# Patient Record
Sex: Female | Born: 1995 | Race: White | Hispanic: No | Marital: Single | Smoking: Never smoker
Health system: Southern US, Community
[De-identification: ages and names within clinical notes are randomized; demographics above are authoritative.]

## PROBLEM LIST (undated history)

## (undated) DIAGNOSIS — F938 Other childhood emotional disorders: Secondary | ICD-10-CM

## (undated) DIAGNOSIS — K219 Gastro-esophageal reflux disease without esophagitis: Secondary | ICD-10-CM

## (undated) DIAGNOSIS — F4323 Adjustment disorder with mixed anxiety and depressed mood: Secondary | ICD-10-CM

## (undated) DIAGNOSIS — F419 Anxiety disorder, unspecified: Secondary | ICD-10-CM

## (undated) DIAGNOSIS — S82201A Unspecified fracture of shaft of right tibia, initial encounter for closed fracture: Secondary | ICD-10-CM

## (undated) DIAGNOSIS — J45909 Unspecified asthma, uncomplicated: Secondary | ICD-10-CM

## (undated) DIAGNOSIS — F410 Panic disorder [episodic paroxysmal anxiety] without agoraphobia: Secondary | ICD-10-CM

## (undated) DIAGNOSIS — S82401A Unspecified fracture of shaft of right fibula, initial encounter for closed fracture: Secondary | ICD-10-CM

## (undated) HISTORY — PX: NO PAST SURGERIES: SHX2092

## (undated) HISTORY — DX: Unspecified fracture of shaft of right tibia, initial encounter for closed fracture: S82.201A

## (undated) HISTORY — DX: Unspecified fracture of shaft of right tibia, initial encounter for closed fracture: S82.401A

---
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
## 2020-11-09 NOTE — L&D Delivery Note (Signed)
OB/GYN Faculty Practice Delivery Note  Michelle Bird is a 25 y.o. G1P0 s/p NSVD at [redacted]w[redacted]d. She was admitted for SOL.   ROM: 0h 60m with clear, scant fluid GBS Status:  Negative/-- (12/06 1527) Maximum Maternal Temperature: N/A  Labor Progress: Initial SVE: 6/100/+1. She then progressed to complete.   Delivery Date/Time: 11/04/2021 @ 20:24 Delivery: Called to room and patient was complete. We began pushing. Head delivered LOA. Single nuchal cord present easily reduced at perineum. Shoulder and body delivered in usual fashion. Infant with spontaneous cry, placed on mother's abdomen, dried and stimulated. Cord clamped x 2 after 1-minute delay, and cut by FOB. Cord blood drawn. Placenta delivered spontaneously with gentle cord traction. Fundus firm with massage and Pitocin. Labia, perineum, vagina, and cervix inspected inspected with 2nd degree lac and left vaginal pumping laceration.   Baby Weight: pending  Placenta: Sent to L&D Complications: None Lacerations: 2nd degree perineal repaired with 2-0 Monocryl; Left vaginal laceration repaired with 3-0 Vicryl. EBL: 350 mL Analgesia: Epidural, local lidocaine for repair.   Infant:  APGAR (1 MIN): 9   APGAR (5 MINS): 9   APGAR (10 MINS):     Jen Mow, DO [facility] [employer], [facility] 11/04/2021, 9:10 PM

## 2021-03-17 ENCOUNTER — Emergency Department (HOSPITAL_BASED_OUTPATIENT_CLINIC_OR_DEPARTMENT_OTHER)
Admission: EM | Admit: 2021-03-17 | Discharge: 2021-03-17 | Disposition: A | Discharge status: Home / Self Care | Payer: 59 | Source: Home / Self Care | Attending: Emergency Medicine | Admitting: Emergency Medicine

## 2021-03-17 ENCOUNTER — Encounter (HOSPITAL_COMMUNITY): Payer: Self-pay

## 2021-03-17 ENCOUNTER — Encounter (HOSPITAL_BASED_OUTPATIENT_CLINIC_OR_DEPARTMENT_OTHER): Payer: Self-pay

## 2021-03-17 ENCOUNTER — Emergency Department (HOSPITAL_BASED_OUTPATIENT_CLINIC_OR_DEPARTMENT_OTHER): Payer: 59

## 2021-03-17 ENCOUNTER — Other Ambulatory Visit: Payer: Self-pay

## 2021-03-17 ENCOUNTER — Inpatient Hospital Stay (HOSPITAL_COMMUNITY)
Admission: EM | Admit: 2021-03-17 | Discharge: 2021-03-19 | DRG: 818 | Disposition: A | Discharge status: Home / Self Care | Payer: 59 | Attending: Orthopedic Surgery | Admitting: Orthopedic Surgery

## 2021-03-17 DIAGNOSIS — Y92410 Unspecified street and highway as the place of occurrence of the external cause: Secondary | ICD-10-CM

## 2021-03-17 DIAGNOSIS — O26819 Pregnancy related exhaustion and fatigue, unspecified trimester: Secondary | ICD-10-CM | POA: Insufficient documentation

## 2021-03-17 DIAGNOSIS — Z3A00 Weeks of gestation of pregnancy not specified: Secondary | ICD-10-CM | POA: Insufficient documentation

## 2021-03-17 DIAGNOSIS — E559 Vitamin D deficiency, unspecified: Secondary | ICD-10-CM

## 2021-03-17 DIAGNOSIS — O2390 Unspecified genitourinary tract infection in pregnancy, unspecified trimester: Secondary | ICD-10-CM | POA: Insufficient documentation

## 2021-03-17 DIAGNOSIS — Z3A01 Less than 8 weeks gestation of pregnancy: Secondary | ICD-10-CM

## 2021-03-17 DIAGNOSIS — R000 Tachycardia, unspecified: Secondary | ICD-10-CM | POA: Insufficient documentation

## 2021-03-17 DIAGNOSIS — Z9889 Other specified postprocedural states: Secondary | ICD-10-CM

## 2021-03-17 DIAGNOSIS — R8271 Bacteriuria: Secondary | ICD-10-CM

## 2021-03-17 DIAGNOSIS — Z3490 Encounter for supervision of normal pregnancy, unspecified, unspecified trimester: Secondary | ICD-10-CM

## 2021-03-17 DIAGNOSIS — S82201A Unspecified fracture of shaft of right tibia, initial encounter for closed fracture: Secondary | ICD-10-CM | POA: Diagnosis present

## 2021-03-17 DIAGNOSIS — R42 Dizziness and giddiness: Secondary | ICD-10-CM | POA: Insufficient documentation

## 2021-03-17 DIAGNOSIS — O9A211 Injury, poisoning and certain other consequences of external causes complicating pregnancy, first trimester: Principal | ICD-10-CM | POA: Diagnosis present

## 2021-03-17 DIAGNOSIS — Z419 Encounter for procedure for purposes other than remedying health state, unspecified: Secondary | ICD-10-CM

## 2021-03-17 DIAGNOSIS — Z20822 Contact with and (suspected) exposure to covid-19: Secondary | ICD-10-CM | POA: Diagnosis present

## 2021-03-17 DIAGNOSIS — O99341 Other mental disorders complicating pregnancy, first trimester: Secondary | ICD-10-CM | POA: Diagnosis present

## 2021-03-17 DIAGNOSIS — F4323 Adjustment disorder with mixed anxiety and depressed mood: Secondary | ICD-10-CM | POA: Diagnosis present

## 2021-03-17 DIAGNOSIS — T148XXA Other injury of unspecified body region, initial encounter: Secondary | ICD-10-CM

## 2021-03-17 DIAGNOSIS — Z79899 Other long term (current) drug therapy: Secondary | ICD-10-CM

## 2021-03-17 DIAGNOSIS — S82401A Unspecified fracture of shaft of right fibula, initial encounter for closed fracture: Secondary | ICD-10-CM | POA: Diagnosis present

## 2021-03-17 DIAGNOSIS — K219 Gastro-esophageal reflux disease without esophagitis: Secondary | ICD-10-CM | POA: Diagnosis present

## 2021-03-17 DIAGNOSIS — O99611 Diseases of the digestive system complicating pregnancy, first trimester: Secondary | ICD-10-CM | POA: Diagnosis present

## 2021-03-17 HISTORY — DX: Gastro-esophageal reflux disease without esophagitis: K21.9

## 2021-03-17 LAB — CBC WITH DIFFERENTIAL/PLATELET
Abs Immature Granulocytes: 0.04 10*3/uL (ref 0.00–0.07)
Basophils Absolute: 0 10*3/uL (ref 0.0–0.1)
Basophils Relative: 0 %
Eosinophils Absolute: 0 10*3/uL (ref 0.0–0.5)
Eosinophils Relative: 0 %
HCT: 39.7 % (ref 36.0–46.0)
Hemoglobin: 13 g/dL (ref 12.0–15.0)
Immature Granulocytes: 0 %
Lymphocytes Relative: 18 %
Lymphs Abs: 2 10*3/uL (ref 0.7–4.0)
MCH: 25.9 pg — ABNORMAL LOW (ref 26.0–34.0)
MCHC: 32.7 g/dL (ref 30.0–36.0)
MCV: 79.1 fL — ABNORMAL LOW (ref 80.0–100.0)
Monocytes Absolute: 0.6 10*3/uL (ref 0.1–1.0)
Monocytes Relative: 5 %
Neutro Abs: 8.2 10*3/uL — ABNORMAL HIGH (ref 1.7–7.7)
Neutrophils Relative %: 77 %
Platelets: 383 10*3/uL (ref 150–400)
RBC: 5.02 MIL/uL (ref 3.87–5.11)
RDW: 14 % (ref 11.5–15.5)
WBC: 10.8 10*3/uL — ABNORMAL HIGH (ref 4.0–10.5)
nRBC: 0 % (ref 0.0–0.2)

## 2021-03-17 LAB — URINALYSIS, ROUTINE W REFLEX MICROSCOPIC
Bilirubin Urine: NEGATIVE
Bilirubin Urine: NEGATIVE
Glucose, UA: NEGATIVE mg/dL
Glucose, UA: NEGATIVE mg/dL
Hgb urine dipstick: NEGATIVE
Hgb urine dipstick: NEGATIVE
Ketones, ur: NEGATIVE mg/dL
Ketones, ur: NEGATIVE mg/dL
Nitrite: NEGATIVE
Nitrite: NEGATIVE
Protein, ur: NEGATIVE mg/dL
Protein, ur: NEGATIVE mg/dL
Specific Gravity, Urine: 1.01 (ref 1.005–1.030)
Specific Gravity, Urine: <1.005 — ABNORMAL LOW (ref 1.005–1.030)
pH: 7 (ref 5.0–8.0)
pH: 6 (ref 5.0–8.0)

## 2021-03-17 LAB — BASIC METABOLIC PANEL
Anion gap: 10 (ref 5–15)
BUN: 8 mg/dL (ref 6–20)
CO2: 22 mmol/L (ref 22–32)
Calcium: 9.4 mg/dL (ref 8.9–10.3)
Chloride: 102 mmol/L (ref 98–111)
Creatinine, Ser: 0.73 mg/dL (ref 0.44–1.00)
GFR, Estimated: >60 mL/min (ref >60)
Glucose, Bld: 97 mg/dL (ref 70–99)
Potassium: 3.7 mmol/L (ref 3.5–5.1)
Sodium: 134 mmol/L — ABNORMAL LOW (ref 135–145)

## 2021-03-17 LAB — URINALYSIS, MICROSCOPIC (REFLEX): RBC / HPF: NONE SEEN RBC/hpf (ref 0–5)

## 2021-03-17 LAB — HCG, QUANTITATIVE, PREGNANCY: hCG, Beta Chain, Quant, S: 33379 m[IU]/mL — ABNORMAL HIGH (ref <5)

## 2021-03-17 LAB — PREGNANCY, URINE: Preg Test, Ur: POSITIVE — AB

## 2021-03-17 MED ORDER — ACETAMINOPHEN 325 MG PO TABS
650.0000 mg | ORAL_TABLET | Freq: Once | ORAL | Status: AC
  Administered 2021-03-17: 650 mg via ORAL
  Filled 2021-03-17: qty 2

## 2021-03-17 MED ORDER — CEPHALEXIN 500 MG PO CAPS: 500.0000 mg | ORAL_CAPSULE | Freq: Three times a day (TID) | ORAL | 0 refills | Status: DC

## 2021-03-17 NOTE — ED Triage Notes (Signed)
Pt c/o feeling light headed at work ~12pm-states she is 14 days late and requests preg test-NAD-steady gait
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
## 2021-03-17 NOTE — ED Notes (Signed)
ED Provider at bedside. 

## 2021-03-17 NOTE — Discharge Instructions (Addendum)
Follow-up with OB/GYN physician within the next 1 to 2 weeks.  Return immediately to the ER if you have fevers worsening pain vaginal bleeding or any additional concerns.

## 2021-03-17 NOTE — ED Notes (Signed)
Patient transported to Ultrasound 

## 2021-03-17 NOTE — ED Notes (Signed)
Pt able to wiggle toes on command

## 2021-03-17 NOTE — ED Provider Notes (Signed)
[facility] EMERGENCY DEPARTMENT Provider Note   CSN: [id number] Arrival date & time: 03/17/21  1551     History Chief Complaint  Patient presents with  . Dizziness    Michelle Bird is a 25 y.o. female.  Patient presents to ER chief complaint of lightheaded and dizziness been going on for about 2 to 3 days.  She was late on her period as well.  She had some lower abdominal cramping intermittent for the past few days.  Denies vaginal bleeding.  No fever no cough no vomiting or diarrhea no headache no chest pain.        Past Medical History:  Diagnosis Date  . Adjustment disorder with mixed anxiety and depressed mood 10/08/2015  . Anxiety   . 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 GERD (gastroesophageal reflux disease)   . Panic attacks     Patient Active Problem List   Diagnosis Date Noted  . Adjustment disorder with mixed anxiety and depressed mood 10/08/2015  . 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 No pertinent surgical history.   OB History   No obstetric history on file.     No family history on file.  Social History   Tobacco Use  . Smoking status: Never Smoker  . Smokeless tobacco: Never Used  Substance Use Topics  . Alcohol use: No  . Drug use: No    Home Medications Prior to Admission medications   Medication Sig Start Date End Date Taking? Authorizing Provider  cephALEXin (KEFLEX) 500 MG capsule Take 1 capsule (500 mg total) by mouth 3 (three) times daily. 03/17/21  Yes Cheryll Cockayne, MD  ibuprofen (ADVIL,MOTRIN) 200 MG tablet Take 400 mg by mouth every 6 (six) hours as needed for headache or moderate pain.    [provider]  omeprazole (PRILOSEC) 40 MG capsule Take 40 mg by mouth daily as needed (upset stomach).    [provider]    Allergies    Patient has no known allergies.  Review of Systems   Review of Systems  Constitutional: Negative for fever.  HENT: Negative for ear  pain.   Eyes: Negative for pain.  Respiratory: Negative for cough.   Cardiovascular: Negative for chest pain.  Gastrointestinal: Negative for vomiting.  Genitourinary: Negative for flank pain.  Musculoskeletal: Negative for back pain.  Skin: Negative for rash.  Neurological: Negative for headaches.    Physical Exam Updated Vital Signs BP 127/82   Pulse (!) 106   Temp 98.6 F (37 C) (Oral)   Resp 18   LMP 01/31/2021   SpO2 100%   Physical Exam Constitutional:      General: She is not in acute distress.    Appearance: Normal appearance.  HENT:     Head: Normocephalic.     Nose: Nose normal.  Eyes:     Extraocular Movements: Extraocular movements intact.  Cardiovascular:     Rate and Rhythm: Normal rate.  Pulmonary:     Effort: Pulmonary effort is normal.  Musculoskeletal:        General: Normal range of motion.     Cervical back: Normal range of motion.  Neurological:     General: No focal deficit present.     Mental Status: She is alert. Mental status is at baseline.     ED Results / Procedures / Treatments   Labs (all labs ordered are listed, but only abnormal results are displayed) Labs Reviewed  URINALYSIS, ROUTINE W  REFLEX MICROSCOPIC - Abnormal; Notable for the following components:      Result Value   APPearance CLOUDY (*)    Leukocytes,Ua MODERATE (*)    All other components within normal limits  PREGNANCY, URINE - Abnormal; Notable for the following components:   Preg Test, Ur POSITIVE (*)    All other components within normal limits  URINALYSIS, MICROSCOPIC (REFLEX) - Abnormal; Notable for the following components:   Bacteria, UA MANY (*)    All other components within normal limits  URINALYSIS, ROUTINE W REFLEX MICROSCOPIC - Abnormal; Notable for the following components:   Specific Gravity, Urine <1.005 (*)    Leukocytes,Ua TRACE (*)    All other components within normal limits  CBC WITH DIFFERENTIAL/PLATELET - Abnormal; Notable for the  following components:   WBC 10.8 (*)    MCV 79.1 (*)    MCH 25.9 (*)    Neutro Abs 8.2 (*)    All other components within normal limits  BASIC METABOLIC PANEL - Abnormal; Notable for the following components:   Sodium 134 (*)    All other components within normal limits  HCG, QUANTITATIVE, PREGNANCY - Abnormal; Notable for the following components:   hCG, Beta Chain, Quant, S 33,379 (*)    All other components within normal limits  URINALYSIS, MICROSCOPIC (REFLEX) - Abnormal; Notable for the following components:   Bacteria, UA RARE (*)    All other components within normal limits    EKG EKG Interpretation  Date/Time:  Monday Mar 17 2021 17:43:04 EDT Ventricular Rate:  100 PR Interval:  152 QRS Duration: 80 QT Interval:  315 QTC Calculation: 407 R Axis:   71 Text Interpretation: Sinus tachycardia Confirmed by Norman Clay ([id number]) on 03/17/2021 6:48:15 PM   Radiology US OB Comp < 14 Wks  Result Date: 03/17/2021 CLINICAL DATA:  Near syncopal episode.  Positive pregnancy test. EXAM: OBSTETRIC <14 WK [place] AND TRANSVAGINAL OB US TECHNIQUE: Both transabdominal and transvaginal ultrasound examinations were performed for complete evaluation of the gestation as well as the maternal uterus, adnexal regions, and pelvic cul-de-sac. Transvaginal technique was performed to assess early pregnancy. COMPARISON:  None. FINDINGS: Intrauterine gestational sac: Present Yolk sac:  Present Embryo:  Present Cardiac Activity: Present Heart Rate: 131 bpm MSD: 16.1 mm   6 w   3 d CRL:  7.2 mm   6 w   4 d                  [place] EDC: 11/06/2021 Subchorionic hemorrhage:  None visualized. Maternal uterus/adnexae: Corpus luteum cyst right ovary. No free pelvic fluid. IMPRESSION: 1. Single living intrauterine embryo estimated at 6 weeks and 4 days gestation. 2. No subchorionic hemorrhage. 3. Corpus luteum cyst right ovary. Electronically Signed   By: Rudie Meyer M.D.   On: 03/17/2021 18:31    Procedures Procedures    Medications Ordered in ED Medications  acetaminophen (TYLENOL) tablet 650 mg (650 mg Oral Given 03/17/21 1855)    ED Course  I have reviewed the triage vital signs and the nursing notes.  Pertinent labs & imaging results that were available during my care of the patient were reviewed by me and considered in my medical decision making (see chart for details).    MDM Rules/Calculators/A&P                          Patient's pregnancy test is positive here in the ER.  Ultrasound showing positive IUP.  Urinalysis shows trace bacteria opted to treat this with Keflex.  Patient discharged home to follow-up with OB/GYN within 2 weeks.  Advising immediate return for vaginal bleeding fevers pain or any additional concerns.  Final Clinical Impression(s) / ED Diagnoses Final diagnoses:  Bacteriuria  Dizziness  Pregnancy, unspecified gestational age    Rx / DC Orders ED Discharge Orders         Ordered    cephALEXin (KEFLEX) 500 MG capsule  3 times daily        03/17/21 1913           Cheryll Cockayne, MD 03/17/21 1914

## 2021-03-17 NOTE — ED Notes (Signed)
Pt given water - ok per MD  Urine re sent

## 2021-03-18 ENCOUNTER — Encounter (HOSPITAL_COMMUNITY): Payer: Self-pay | Admitting: Radiology

## 2021-03-18 ENCOUNTER — Inpatient Hospital Stay (HOSPITAL_COMMUNITY): Payer: 59

## 2021-03-18 ENCOUNTER — Emergency Department (HOSPITAL_COMMUNITY): Payer: 59

## 2021-03-18 ENCOUNTER — Inpatient Hospital Stay (HOSPITAL_COMMUNITY): Payer: 59 | Admitting: Certified Registered Nurse Anesthetist

## 2021-03-18 ENCOUNTER — Encounter (HOSPITAL_COMMUNITY)
Admission: EM | Disposition: A | Discharge status: Home / Self Care | Payer: Self-pay | Source: Home / Self Care | Attending: Orthopaedic Surgery

## 2021-03-18 DIAGNOSIS — O9A211 Injury, poisoning and certain other consequences of external causes complicating pregnancy, first trimester: Secondary | ICD-10-CM | POA: Diagnosis present

## 2021-03-18 DIAGNOSIS — S82251A Displaced comminuted fracture of shaft of right tibia, initial encounter for closed fracture: Secondary | ICD-10-CM

## 2021-03-18 DIAGNOSIS — O99341 Other mental disorders complicating pregnancy, first trimester: Secondary | ICD-10-CM | POA: Diagnosis present

## 2021-03-18 DIAGNOSIS — O99611 Diseases of the digestive system complicating pregnancy, first trimester: Secondary | ICD-10-CM | POA: Diagnosis present

## 2021-03-18 DIAGNOSIS — Z20822 Contact with and (suspected) exposure to covid-19: Secondary | ICD-10-CM | POA: Diagnosis present

## 2021-03-18 DIAGNOSIS — S82451A Displaced comminuted fracture of shaft of right fibula, initial encounter for closed fracture: Secondary | ICD-10-CM | POA: Diagnosis not present

## 2021-03-18 DIAGNOSIS — Z79899 Other long term (current) drug therapy: Secondary | ICD-10-CM | POA: Diagnosis not present

## 2021-03-18 DIAGNOSIS — Z3A01 Less than 8 weeks gestation of pregnancy: Secondary | ICD-10-CM

## 2021-03-18 DIAGNOSIS — S82401A Unspecified fracture of shaft of right fibula, initial encounter for closed fracture: Secondary | ICD-10-CM

## 2021-03-18 DIAGNOSIS — K219 Gastro-esophageal reflux disease without esophagitis: Secondary | ICD-10-CM | POA: Diagnosis present

## 2021-03-18 DIAGNOSIS — S82201A Unspecified fracture of shaft of right tibia, initial encounter for closed fracture: Secondary | ICD-10-CM | POA: Diagnosis present

## 2021-03-18 DIAGNOSIS — Y92410 Unspecified street and highway as the place of occurrence of the external cause: Secondary | ICD-10-CM | POA: Diagnosis not present

## 2021-03-18 DIAGNOSIS — F4323 Adjustment disorder with mixed anxiety and depressed mood: Secondary | ICD-10-CM | POA: Diagnosis present

## 2021-03-18 HISTORY — PX: TIBIA IM NAIL INSERTION: SHX2516

## 2021-03-18 LAB — CBC WITH DIFFERENTIAL/PLATELET
Abs Immature Granulocytes: 0.05 10*3/uL (ref 0.00–0.07)
Basophils Absolute: 0 10*3/uL (ref 0.0–0.1)
Basophils Relative: 0 %
Eosinophils Absolute: 0 10*3/uL (ref 0.0–0.5)
Eosinophils Relative: 0 %
HCT: 37.6 % (ref 36.0–46.0)
Hemoglobin: 11.9 g/dL — ABNORMAL LOW (ref 12.0–15.0)
Immature Granulocytes: 0 %
Lymphocytes Relative: 8 %
Lymphs Abs: 1.2 10*3/uL (ref 0.7–4.0)
MCH: 25.6 pg — ABNORMAL LOW (ref 26.0–34.0)
MCHC: 31.6 g/dL (ref 30.0–36.0)
MCV: 80.9 fL (ref 80.0–100.0)
Monocytes Absolute: 0.6 10*3/uL (ref 0.1–1.0)
Monocytes Relative: 4 %
Neutro Abs: 13.3 10*3/uL — ABNORMAL HIGH (ref 1.7–7.7)
Neutrophils Relative %: 88 %
Platelets: 374 10*3/uL (ref 150–400)
RBC: 4.65 MIL/uL (ref 3.87–5.11)
RDW: 14 % (ref 11.5–15.5)
WBC: 15.2 10*3/uL — ABNORMAL HIGH (ref 4.0–10.5)
nRBC: 0 % (ref 0.0–0.2)

## 2021-03-18 LAB — BASIC METABOLIC PANEL
Anion gap: 10 (ref 5–15)
BUN: 8 mg/dL (ref 6–20)
CO2: 21 mmol/L — ABNORMAL LOW (ref 22–32)
Calcium: 9.2 mg/dL (ref 8.9–10.3)
Chloride: 103 mmol/L (ref 98–111)
Creatinine, Ser: 0.86 mg/dL (ref 0.44–1.00)
GFR, Estimated: >60 mL/min (ref >60)
Glucose, Bld: 128 mg/dL — ABNORMAL HIGH (ref 70–99)
Potassium: 4.1 mmol/L (ref 3.5–5.1)
Sodium: 134 mmol/L — ABNORMAL LOW (ref 135–145)

## 2021-03-18 LAB — SURGICAL PCR SCREEN
MRSA, PCR: NEGATIVE
Staphylococcus aureus: NEGATIVE

## 2021-03-18 LAB — RESP PANEL BY RT-PCR (FLU A&B, COVID) ARPGX2
Influenza A by PCR: NEGATIVE
Influenza B by PCR: NEGATIVE
SARS Coronavirus 2 by RT PCR: NEGATIVE

## 2021-03-18 SURGERY — INSERTION, INTRAMEDULLARY ROD, TIBIA
Anesthesia: Spinal | Site: Leg Lower | Laterality: Right

## 2021-03-18 MED ORDER — PROPOFOL 500 MG/50ML IV EMUL
INTRAVENOUS | Status: DC | PRN
  Administered 2021-03-18: 30 mg via INTRAVENOUS
  Administered 2021-03-18: 50 ug/kg/min via INTRAVENOUS
  Administered 2021-03-18: 100 ug/kg/min via INTRAVENOUS

## 2021-03-18 MED ORDER — MORPHINE SULFATE (PF) 2 MG/ML IV SOLN
0.5000 mg | INTRAVENOUS | Status: DC | PRN
  Administered 2021-03-19: 0.5 mg via INTRAVENOUS
  Filled 2021-03-18: qty 1

## 2021-03-18 MED ORDER — ENOXAPARIN SODIUM 40 MG/0.4ML IJ SOSY
40.0000 mg | PREFILLED_SYRINGE | Freq: Every day | INTRAMUSCULAR | Status: DC
  Administered 2021-03-19: 40 mg via SUBCUTANEOUS
  Filled 2021-03-18: qty 0.4

## 2021-03-18 MED ORDER — DEXAMETHASONE SODIUM PHOSPHATE 10 MG/ML IJ SOLN
INTRAMUSCULAR | Status: AC
  Filled 2021-03-18: qty 1

## 2021-03-18 MED ORDER — PANTOPRAZOLE SODIUM 40 MG PO TBEC: 40.0000 mg | DELAYED_RELEASE_TABLET | Freq: Every day | ORAL | Status: DC

## 2021-03-18 MED ORDER — ONDANSETRON HCL 4 MG/2ML IJ SOLN
INTRAMUSCULAR | Status: AC
  Filled 2021-03-18: qty 2

## 2021-03-18 MED ORDER — ACETAMINOPHEN 325 MG PO TABS: 325.0000 mg | ORAL_TABLET | Freq: Four times a day (QID) | ORAL | Status: DC | PRN

## 2021-03-18 MED ORDER — ORAL CARE MOUTH RINSE: 15.0000 mL | Freq: Once | OROMUCOSAL | Status: AC

## 2021-03-18 MED ORDER — OXYCODONE HCL 5 MG/5ML PO SOLN
5.0000 mg | Freq: Once | ORAL | Status: DC | PRN
Start: 2021-03-18 — End: 2021-03-18

## 2021-03-18 MED ORDER — ACETAMINOPHEN 160 MG/5ML PO SOLN: 325.0000 mg | ORAL | Status: DC | PRN

## 2021-03-18 MED ORDER — HYDROCODONE-ACETAMINOPHEN 5-325 MG PO TABS
1.0000 | ORAL_TABLET | ORAL | Status: DC | PRN
Start: 2021-03-18 — End: 2021-03-20
  Administered 2021-03-18 – 2021-03-19 (×5): 2 via ORAL
  Filled 2021-03-18 (×2): qty 1
  Filled 2021-03-18 (×4): qty 2

## 2021-03-18 MED ORDER — PHENYLEPHRINE 40 MCG/ML (10ML) SYRINGE FOR IV PUSH (FOR BLOOD PRESSURE SUPPORT)
PREFILLED_SYRINGE | INTRAVENOUS | Status: AC
  Filled 2021-03-18: qty 10

## 2021-03-18 MED ORDER — METOCLOPRAMIDE HCL 5 MG PO TABS
5.0000 mg | ORAL_TABLET | Freq: Three times a day (TID) | ORAL | Status: DC | PRN
Start: 2021-03-18 — End: 2021-03-20

## 2021-03-18 MED ORDER — LIDOCAINE 2% (20 MG/ML) 5 ML SYRINGE
INTRAMUSCULAR | Status: AC
  Filled 2021-03-18: qty 5

## 2021-03-18 MED ORDER — ACETAMINOPHEN 325 MG PO TABS: 325.0000 mg | ORAL_TABLET | ORAL | Status: DC | PRN

## 2021-03-18 MED ORDER — ONDANSETRON HCL 4 MG/2ML IJ SOLN: 4.0000 mg | Freq: Once | INTRAMUSCULAR | Status: DC | PRN

## 2021-03-18 MED ORDER — MIDAZOLAM HCL 2 MG/2ML IJ SOLN
INTRAMUSCULAR | Status: AC
  Filled 2021-03-18: qty 2

## 2021-03-18 MED ORDER — HYDROCODONE-ACETAMINOPHEN 5-325 MG PO TABS
1.0000 | ORAL_TABLET | ORAL | Status: DC | PRN
  Administered 2021-03-18: 2 via ORAL
  Filled 2021-03-18: qty 2

## 2021-03-18 MED ORDER — MORPHINE SULFATE (PF) 2 MG/ML IV SOLN: 0.5000 mg | INTRAVENOUS | Status: DC | PRN

## 2021-03-18 MED ORDER — CHLORHEXIDINE GLUCONATE 0.12 % MT SOLN
OROMUCOSAL | Status: AC
  Administered 2021-03-18: 15 mL via OROMUCOSAL
  Filled 2021-03-18: qty 15

## 2021-03-18 MED ORDER — DOCUSATE SODIUM 100 MG PO CAPS
100.0000 mg | ORAL_CAPSULE | Freq: Two times a day (BID) | ORAL | Status: DC
  Administered 2021-03-18 – 2021-03-19 (×2): 100 mg via ORAL
  Filled 2021-03-18 (×2): qty 1

## 2021-03-18 MED ORDER — CEFAZOLIN SODIUM-DEXTROSE 2-3 GM-%(50ML) IV SOLR
INTRAVENOUS | Status: DC | PRN
  Administered 2021-03-18: 2 g via INTRAVENOUS

## 2021-03-18 MED ORDER — HYDROCODONE-ACETAMINOPHEN 7.5-325 MG PO TABS: 1.0000 | ORAL_TABLET | ORAL | Status: DC | PRN

## 2021-03-18 MED ORDER — METHOCARBAMOL 500 MG PO TABS: 500.0000 mg | ORAL_TABLET | Freq: Four times a day (QID) | ORAL | Status: DC | PRN

## 2021-03-18 MED ORDER — SODIUM CHLORIDE 0.9 % IV SOLN: INTRAVENOUS | Status: DC

## 2021-03-18 MED ORDER — CEPHALEXIN 500 MG PO CAPS: 500.0000 mg | ORAL_CAPSULE | Freq: Three times a day (TID) | ORAL | Status: DC

## 2021-03-18 MED ORDER — LACTATED RINGERS IV SOLN: INTRAVENOUS | Status: DC

## 2021-03-18 MED ORDER — METHOCARBAMOL 1000 MG/10ML IJ SOLN
500.0000 mg | Freq: Four times a day (QID) | INTRAVENOUS | Status: DC | PRN
  Filled 2021-03-18 (×3): qty 5

## 2021-03-18 MED ORDER — ONDANSETRON HCL 4 MG/2ML IJ SOLN
4.0000 mg | Freq: Four times a day (QID) | INTRAMUSCULAR | Status: DC | PRN
  Administered 2021-03-18: 4 mg via INTRAVENOUS
  Filled 2021-03-18: qty 2

## 2021-03-18 MED ORDER — ONDANSETRON HCL 4 MG PO TABS: 4.0000 mg | ORAL_TABLET | Freq: Four times a day (QID) | ORAL | Status: DC | PRN

## 2021-03-18 MED ORDER — FENTANYL CITRATE (PF) 100 MCG/2ML IJ SOLN: 25.0000 ug | INTRAMUSCULAR | Status: DC | PRN

## 2021-03-18 MED ORDER — DIPHENHYDRAMINE HCL 12.5 MG/5ML PO ELIX: 12.5000 mg | ORAL_SOLUTION | ORAL | Status: DC | PRN

## 2021-03-18 MED ORDER — ONDANSETRON HCL 4 MG/2ML IJ SOLN: 4.0000 mg | Freq: Four times a day (QID) | INTRAMUSCULAR | Status: DC | PRN

## 2021-03-18 MED ORDER — FENTANYL CITRATE (PF) 100 MCG/2ML IJ SOLN
INTRAMUSCULAR | Status: DC | PRN
  Administered 2021-03-18: 50 ug via INTRAVENOUS

## 2021-03-18 MED ORDER — 0.9 % SODIUM CHLORIDE (POUR BTL) OPTIME
TOPICAL | Status: DC | PRN
  Administered 2021-03-18: 1000 mL

## 2021-03-18 MED ORDER — ACETAMINOPHEN 500 MG PO TABS
500.0000 mg | ORAL_TABLET | Freq: Two times a day (BID) | ORAL | Status: DC
  Administered 2021-03-18 – 2021-03-19 (×2): 500 mg via ORAL
  Filled 2021-03-18 (×2): qty 1

## 2021-03-18 MED ORDER — PHENYLEPHRINE 40 MCG/ML (10ML) SYRINGE FOR IV PUSH (FOR BLOOD PRESSURE SUPPORT)
PREFILLED_SYRINGE | INTRAVENOUS | Status: DC | PRN
  Administered 2021-03-18: 40 ug via INTRAVENOUS
  Administered 2021-03-18: 80 ug via INTRAVENOUS
  Administered 2021-03-18: 40 ug via INTRAVENOUS
  Administered 2021-03-18 (×2): 80 ug via INTRAVENOUS
  Administered 2021-03-18: 40 ug via INTRAVENOUS

## 2021-03-18 MED ORDER — POTASSIUM CHLORIDE IN NACL 20-0.9 MEQ/L-% IV SOLN
INTRAVENOUS | Status: DC
  Filled 2021-03-18 (×2): qty 1000

## 2021-03-18 MED ORDER — DEXAMETHASONE SODIUM PHOSPHATE 10 MG/ML IJ SOLN
INTRAMUSCULAR | Status: DC | PRN
  Administered 2021-03-18: 5 mg via INTRAVENOUS

## 2021-03-18 MED ORDER — FENTANYL CITRATE (PF) 250 MCG/5ML IJ SOLN
INTRAMUSCULAR | Status: AC
  Filled 2021-03-18: qty 5

## 2021-03-18 MED ORDER — PHENYLEPHRINE HCL-NACL 10-0.9 MG/250ML-% IV SOLN
INTRAVENOUS | Status: DC | PRN
  Administered 2021-03-18: 30 ug/min via INTRAVENOUS

## 2021-03-18 MED ORDER — ONDANSETRON HCL 4 MG/2ML IJ SOLN
INTRAMUSCULAR | Status: DC | PRN
  Administered 2021-03-18: 4 mg via INTRAVENOUS

## 2021-03-18 MED ORDER — CEFAZOLIN SODIUM-DEXTROSE 2-4 GM/100ML-% IV SOLN
INTRAVENOUS | Status: AC
  Filled 2021-03-18: qty 100

## 2021-03-18 MED ORDER — FENTANYL CITRATE (PF) 100 MCG/2ML IJ SOLN
50.0000 ug | Freq: Once | INTRAMUSCULAR | Status: AC
  Administered 2021-03-18: 50 ug via INTRAVENOUS
  Filled 2021-03-18: qty 2

## 2021-03-18 MED ORDER — PROPOFOL 10 MG/ML IV BOLUS
INTRAVENOUS | Status: AC
  Filled 2021-03-18: qty 20

## 2021-03-18 MED ORDER — SODIUM CHLORIDE 0.9 % IV SOLN
1.0000 g | Freq: Four times a day (QID) | INTRAVENOUS | Status: AC
  Administered 2021-03-18 – 2021-03-19 (×3): 1 g via INTRAVENOUS
  Filled 2021-03-18 (×3): qty 10

## 2021-03-18 MED ORDER — METOCLOPRAMIDE HCL 5 MG/ML IJ SOLN: 5.0000 mg | Freq: Three times a day (TID) | INTRAMUSCULAR | Status: DC | PRN

## 2021-03-18 MED ORDER — MEPERIDINE HCL 25 MG/ML IJ SOLN: 6.2500 mg | INTRAMUSCULAR | Status: DC | PRN

## 2021-03-18 MED ORDER — BUPIVACAINE IN DEXTROSE 0.75-8.25 % IT SOLN
INTRATHECAL | Status: DC | PRN
  Administered 2021-03-18: 2 mL via INTRATHECAL

## 2021-03-18 MED ORDER — CHLORHEXIDINE GLUCONATE 0.12 % MT SOLN: 15.0000 mL | Freq: Once | OROMUCOSAL | Status: AC

## 2021-03-18 MED ORDER — OXYCODONE HCL 5 MG PO TABS: 5.0000 mg | ORAL_TABLET | Freq: Once | ORAL | Status: DC | PRN

## 2021-03-18 SURGICAL SUPPLY — 66 items
BIT DRILL CALIBRATED 4.3X320MM (BIT) IMPLANT
BIT DRILL TWSTPONT CRW 3.2X180 (DRILL) IMPLANT
BLADE SURG 10 STRL SS (BLADE) ×1 IMPLANT
BNDG ELASTIC 4X5.8 VLCR NS LF (GAUZE/BANDAGES/DRESSINGS) ×1 IMPLANT
BNDG ELASTIC 4X5.8 VLCR STR LF (GAUZE/BANDAGES/DRESSINGS) ×2 IMPLANT
BNDG ELASTIC 6X5.8 VLCR STR LF (GAUZE/BANDAGES/DRESSINGS) ×2 IMPLANT
BNDG GAUZE ELAST 4 BULKY (GAUZE/BANDAGES/DRESSINGS) ×2 IMPLANT
BRUSH SCRUB EZ PLAIN DRY (MISCELLANEOUS) ×4 IMPLANT
COVER SURGICAL LIGHT HANDLE (MISCELLANEOUS) ×3 IMPLANT
COVER WAND RF STERILE (DRAPES) ×1 IMPLANT
DRAPE C-ARM 42X72 X-RAY (DRAPES) ×2 IMPLANT
DRAPE C-ARMOR (DRAPES) ×2 IMPLANT
DRAPE HALF SHEET 40X57 (DRAPES) ×2 IMPLANT
DRAPE INCISE IOBAN 66X45 STRL (DRAPES) IMPLANT
DRAPE U-SHAPE 47X51 STRL (DRAPES) ×2 IMPLANT
DRESSING MEPILEX FLEX 4X4 (GAUZE/BANDAGES/DRESSINGS) IMPLANT
DRILL CALIBRATED 4.3X320MM (BIT) ×2
DRILL TWISTPOINT CROWE 3.2X180 (DRILL) ×2
DRSG ADAPTIC 3X8 NADH LF (GAUZE/BANDAGES/DRESSINGS) ×1 IMPLANT
DRSG MEPILEX FLEX 4X4 (GAUZE/BANDAGES/DRESSINGS) ×4
DRSG MEPITEL 4X7.2 (GAUZE/BANDAGES/DRESSINGS) ×2 IMPLANT
DRSG PAD ABDOMINAL 8X10 ST (GAUZE/BANDAGES/DRESSINGS) ×4 IMPLANT
ELECT REM PT RETURN 9FT ADLT (ELECTROSURGICAL) ×2
ELECTRODE REM PT RTRN 9FT ADLT (ELECTROSURGICAL) ×1 IMPLANT
GAUZE SPONGE 4X4 12PLY STRL (GAUZE/BANDAGES/DRESSINGS) ×1 IMPLANT
GAUZE SPONGE 4X4 12PLY STRL LF (GAUZE/BANDAGES/DRESSINGS) ×1 IMPLANT
GLOVE BIO SURGEON STRL SZ7.5 (GLOVE) ×2 IMPLANT
GLOVE BIO SURGEON STRL SZ8 (GLOVE) ×2 IMPLANT
GLOVE BIO SURGEON STRL SZ8.5 (GLOVE) ×2 IMPLANT
GLOVE BIOGEL PI IND STRL 7.5 (GLOVE) ×1 IMPLANT
GLOVE BIOGEL PI INDICATOR 7.5 (GLOVE) ×1
GLOVE SRG 8 PF TXTR STRL LF DI (GLOVE) ×1 IMPLANT
GLOVE SURG UNDER POLY LF SZ8 (GLOVE) ×2
GOWN STRL REUS W/ TWL LRG LVL3 (GOWN DISPOSABLE) ×2 IMPLANT
GOWN STRL REUS W/ TWL XL LVL3 (GOWN DISPOSABLE) ×1 IMPLANT
GOWN STRL REUS W/TWL LRG LVL3 (GOWN DISPOSABLE) ×4
GOWN STRL REUS W/TWL XL LVL3 (GOWN DISPOSABLE) ×4
GUIDEWIRE 2.6X80 BEAD TIP (WIRE) IMPLANT
GUIDWIRE 2.6X80 BEAD TIP (WIRE) ×2
KIT BASIN OR (CUSTOM PROCEDURE TRAY) ×2 IMPLANT
KIT TURNOVER KIT B (KITS) ×2 IMPLANT
NAIL TIBIAL PHOENIX 7.5X370MM (Nail) ×1 IMPLANT
PACK ORTHO EXTREMITY (CUSTOM PROCEDURE TRAY) ×2 IMPLANT
PAD ARMBOARD 7.5X6 YLW CONV (MISCELLANEOUS) ×4 IMPLANT
PAD CAST 4YDX4 CTTN HI CHSV (CAST SUPPLIES) ×1 IMPLANT
PADDING CAST ABS 4INX4YD NS (CAST SUPPLIES) ×1
PADDING CAST ABS 6INX4YD NS (CAST SUPPLIES) ×1
PADDING CAST ABS COTTON 4X4 ST (CAST SUPPLIES) IMPLANT
PADDING CAST ABS COTTON 6X4 NS (CAST SUPPLIES) IMPLANT
PADDING CAST COTTON 4X4 STRL (CAST SUPPLIES) ×2
PADDING CAST COTTON 6X4 STRL (CAST SUPPLIES) ×2 IMPLANT
SCREW CORT TI DBL LEAD 4X34 (Screw) ×1 IMPLANT
SCREW CORT TI DBL LEAD 4X42 (Screw) ×1 IMPLANT
SCREW CORT TI DBL LEAD 5X56 (Screw) ×1 IMPLANT
SCREW CORT TI DBLE LEAD 5X52 (Screw) ×1 IMPLANT
SPONGE LAP 18X18 RF (DISPOSABLE) ×1 IMPLANT
STAPLER VISISTAT 35W (STAPLE) ×1 IMPLANT
SUT ETHILON 3 0 PS 1 (SUTURE) ×3 IMPLANT
SUT VIC AB 0 CT1 27 (SUTURE) ×2
SUT VIC AB 0 CT1 27XBRD ANBCTR (SUTURE) IMPLANT
SUT VIC AB 2-0 CT1 27 (SUTURE) ×2
SUT VIC AB 2-0 CT1 TAPERPNT 27 (SUTURE) ×1 IMPLANT
TOWEL GREEN STERILE (TOWEL DISPOSABLE) ×4 IMPLANT
TOWEL GREEN STERILE FF (TOWEL DISPOSABLE) ×2 IMPLANT
TRAY CATH INTERMITTENT SS 16FR (CATHETERS) ×1 IMPLANT
YANKAUER SUCT BULB TIP NO VENT (SUCTIONS) IMPLANT

## 2021-03-18 NOTE — Plan of Care (Signed)
  Problem: Coping: Goal: Level of anxiety will decrease Outcome: Progressing   Problem: Pain Managment: Goal: General experience of comfort will improve Outcome: Progressing   
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
## 2021-03-18 NOTE — Plan of Care (Signed)

## 2021-03-18 NOTE — Anesthesia Procedure Notes (Addendum)
Spinal  Patient location during procedure: OR Start time: 03/18/2021 10:37 AM End time: 03/18/2021 10:44 AM Reason for block: surgical anesthesia Staffing Anesthesiologist: Bethena Midget, MD Preanesthetic Checklist Completed: patient identified, IV checked, site marked, risks and benefits discussed, surgical consent, monitors and equipment checked, pre-op evaluation and timeout performed Spinal Block Patient position: sitting Prep: DuraPrep Patient monitoring: heart rate, cardiac monitor, continuous pulse ox and blood pressure Approach: midline Location: L3-4 Injection technique: single-shot Needle Needle type: Sprotte  Needle gauge: 24 G Needle length: 9 cm Assessment Sensory level: T4 Events: CSF return

## 2021-03-18 NOTE — Anesthesia Preprocedure Evaluation (Addendum)
Anesthesia Evaluation  Patient identified by MRN, date of birth, ID band Patient awake    Reviewed: Allergy & Precautions, H&P , NPO status , Patient's Chart, lab work & pertinent test results, reviewed documented beta blocker date and time   Airway Mallampati: II  TM Distance: >3 FB Neck ROM: full    Dental no notable dental hx. (+) Teeth Intact, Dental Advisory Given, Poor Dentition   Pulmonary neg pulmonary ROS,    Pulmonary exam normal breath sounds clear to auscultation       Cardiovascular Exercise Tolerance: Good negative cardio ROS   Rhythm:regular Rate:Normal     Neuro/Psych PSYCHIATRIC DISORDERS Anxiety negative neurological ROS     GI/Hepatic Neg liver ROS, GERD  Medicated,  Endo/Other  negative endocrine ROS  Renal/GU negative Renal ROS  negative genitourinary   Musculoskeletal   Abdominal   Peds  Hematology negative hematology ROS (+)   Anesthesia Other Findings   Reproductive/Obstetrics (+) Pregnancy                            Anesthesia Physical Anesthesia Plan  ASA: II  Anesthesia Plan: Spinal   Post-op Pain Management:    Induction: Intravenous  PONV Risk Score and Plan: 3 and Ondansetron and Propofol infusion  Airway Management Planned: Natural Airway and Nasal Cannula  Additional Equipment: None  Intra-op Plan:   Post-operative Plan: Extubation in OR  Informed Consent: I have reviewed the patients History and Physical, chart, labs and discussed the procedure including the risks, benefits and alternatives for the proposed anesthesia with the patient or authorized representative who has indicated his/her understanding and acceptance.     Dental Advisory Given  Plan Discussed with: CRNA and Anesthesiologist  Anesthesia Plan Comments: ( )       Anesthesia Quick Evaluation

## 2021-03-18 NOTE — Anesthesia Postprocedure Evaluation (Signed)
Anesthesia Post Note  Patient: Michelle Bird  Procedure(s) Performed: INTRAMEDULLARY (IM) NAIL TIBIAL (Right Leg Lower)     Patient location during evaluation: PACU Anesthesia Type: Spinal Level of consciousness: oriented and awake and alert Pain management: pain level controlled Vital Signs Assessment: post-procedure vital signs reviewed and stable Respiratory status: spontaneous breathing, respiratory function stable and patient connected to nasal cannula oxygen Cardiovascular status: blood pressure returned to baseline and stable Postop Assessment: no headache, no backache and no apparent nausea or vomiting Anesthetic complications: no   No complications documented.  Last Vitals:  Vitals:   03/18/21 1430 03/18/21 1536  BP: 91/60 97/72  Pulse: 82 96  Resp: 14 14  Temp: 36.8 C 36.7 C  SpO2: 99% 100%    Last Pain:  Vitals:   03/18/21 2000  TempSrc:   PainSc: 0-No pain                 Hilmar Moldovan

## 2021-03-18 NOTE — ED Notes (Signed)
Call back left for 6N
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
## 2021-03-18 NOTE — Anesthesia Procedure Notes (Addendum)
Procedure Name: MAC Date/Time: 03/18/2021 10:35 AM Performed by: Reece Agar, CRNA Pre-anesthesia Checklist: Patient identified, Emergency Drugs available, Suction available, Patient being monitored and Timeout performed Oxygen Delivery Method: Nasal cannula

## 2021-03-18 NOTE — Transfer of Care (Addendum)
Immediate Anesthesia Transfer of Care Note  Patient: Michelle Bird  Procedure(s) Performed: INTRAMEDULLARY (IM) NAIL TIBIAL (Right Leg Lower)  Patient Location: PACU  Anesthesia Type:MAC and Spinal  Level of Consciousness: drowsy and patient cooperative  Airway & Oxygen Therapy: Patient Spontanous Breathing and Patient connected to nasal cannula oxygen  Post-op Assessment: Report given to RN and Post -op Vital signs reviewed and stable  Post vital signs: Reviewed and stable  Last Vitals:  Vitals Value Taken Time  BP 94/74 03/18/21 1237  Temp    Pulse 99 03/18/21 1237  Resp 17 03/18/21 1238  SpO2 100 % 03/18/21 1237  Vitals shown include unvalidated device data.  Last Pain:  Vitals:   03/18/21 0919  TempSrc:   PainSc: 3       Patients Stated Pain Goal: 1 (38/33/38 3291)  Complications: No complications documented.
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
## 2021-03-18 NOTE — H&P (Signed)
See consultation note there was just submitted and can be used as a current history and physical exam for documentation purposes.

## 2021-03-18 NOTE — TOC Progression Note (Signed)
Transition of Care [facility]) - Progression Note    Patient Details  Name: Michelle Bird MRN: [id number] Date of Birth: June 21, 1996  Transition of Care [facility]) CM/SW Contact  Ralene Bathe, LCSWA Phone Number: 03/18/2021, 3:03 PM  Clinical Narrative:    CSW following patient for any d/c planning needs once medically stable.  Pending OT/PT recommendations.  Cleon Gustin, MSW, LCSWA        Expected Discharge Plan and Services                                                 Social Determinants of Health (SDOH) Interventions    Readmission Risk Interventions No flowsheet data found.
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
## 2021-03-18 NOTE — Progress Notes (Signed)
Orthopedic Tech Progress Note Patient Details:  Michelle Bird 27-Jun-1996 [id number]  Ortho Devices Type of Ortho Device: Post (long leg) splint,Stirrup splint Ortho Device/Splint Location: rle. posterior long leg applied to mid thigh, stirrups applied to knee height. both splints are plaster. Bronson Curb from ortho assisted with application. Ortho Device/Splint Interventions: Ordered,Application,Adjustment   Post Interventions Patient Tolerated: Well Instructions Provided: Care of device,Adjustment of device   [name] Post 03/18/2021, 2:36 AM
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
## 2021-03-19 ENCOUNTER — Inpatient Hospital Stay (HOSPITAL_COMMUNITY): Payer: 59

## 2021-03-19 ENCOUNTER — Encounter (HOSPITAL_COMMUNITY): Payer: Self-pay | Admitting: Orthopedic Surgery

## 2021-03-19 DIAGNOSIS — E559 Vitamin D deficiency, unspecified: Secondary | ICD-10-CM

## 2021-03-19 DIAGNOSIS — Z3490 Encounter for supervision of normal pregnancy, unspecified, unspecified trimester: Secondary | ICD-10-CM

## 2021-03-19 LAB — CBC
HCT: 32.3 % — ABNORMAL LOW (ref 36.0–46.0)
Hemoglobin: 10.2 g/dL — ABNORMAL LOW (ref 12.0–15.0)
MCH: 25.6 pg — ABNORMAL LOW (ref 26.0–34.0)
MCHC: 31.6 g/dL (ref 30.0–36.0)
MCV: 81.2 fL (ref 80.0–100.0)
Platelets: 312 10*3/uL (ref 150–400)
RBC: 3.98 MIL/uL (ref 3.87–5.11)
RDW: 14.1 % (ref 11.5–15.5)
WBC: 10.8 10*3/uL — ABNORMAL HIGH (ref 4.0–10.5)
nRBC: 0 % (ref 0.0–0.2)

## 2021-03-19 LAB — VITAMIN D 25 HYDROXY (VIT D DEFICIENCY, FRACTURES): Vit D, 25-Hydroxy: 18.81 ng/mL — ABNORMAL LOW (ref 30–100)

## 2021-03-19 MED ORDER — DOCUSATE SODIUM 100 MG PO CAPS: 100.0000 mg | ORAL_CAPSULE | Freq: Two times a day (BID) | ORAL | 0 refills | Status: DC

## 2021-03-19 MED ORDER — COMPLETENATE 29-1 MG PO CHEW: 1.0000 | CHEWABLE_TABLET | Freq: Every day | ORAL | 9 refills | Status: DC

## 2021-03-19 MED ORDER — HYDROCODONE-ACETAMINOPHEN 5-325 MG PO TABS: 1.0000 | ORAL_TABLET | Freq: Four times a day (QID) | ORAL | 0 refills | Status: DC | PRN

## 2021-03-19 MED ORDER — ACETAMINOPHEN 500 MG PO TABS: 500.0000 mg | ORAL_TABLET | Freq: Two times a day (BID) | ORAL | 0 refills | Status: DC
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
## 2021-03-19 NOTE — Progress Notes (Signed)
   03/19/21 1949  AVS Discharge Documentation  AVS Discharge Instructions Including Medications Provided to patient/caregiver  Name of Person Receiving AVS Discharge Instructions Including Medications Jodie Echevaria  Name of Clinician That Reviewed AVS Discharge Instructions Including Medications Amil Amen, RN    Patient educated about discharge and follow up instructions and verbalized an understanding to teaching. Patient IV removed prior to d/c, tolerated well. Patient discharge to home with all personal belongings, care equipment (roller walker, shower chair) and wound care supplies and left unit via wheelchair, no issues.
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
## 2021-03-19 NOTE — Plan of Care (Signed)
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
## 2021-03-20 ENCOUNTER — Encounter (HOSPITAL_COMMUNITY): Payer: Self-pay | Admitting: Orthopedic Surgery
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
## 2021-05-01 ENCOUNTER — Ambulatory Visit: Payer: Medicaid Other

## 2021-05-01 DIAGNOSIS — Z3403 Encounter for supervision of normal first pregnancy, third trimester: Secondary | ICD-10-CM | POA: Insufficient documentation

## 2021-05-01 DIAGNOSIS — Z3401 Encounter for supervision of normal first pregnancy, first trimester: Secondary | ICD-10-CM | POA: Insufficient documentation

## 2021-05-01 DIAGNOSIS — Z3480 Encounter for supervision of other normal pregnancy, unspecified trimester: Secondary | ICD-10-CM

## 2021-05-01 MED ORDER — PANTOPRAZOLE SODIUM 20 MG PO TBEC: 20.0000 mg | DELAYED_RELEASE_TABLET | Freq: Every day | ORAL | 0 refills | Status: DC

## 2021-05-01 MED ORDER — BLOOD PRESSURE MONITOR DEVI: 0 refills | Status: DC

## 2021-05-01 MED ORDER — PREPLUS 27-1 MG PO TABS: 1.0000 | ORAL_TABLET | Freq: Every day | ORAL | 13 refills | Status: DC
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
## 2021-05-08 ENCOUNTER — Telehealth: Payer: Self-pay

## 2021-05-08 ENCOUNTER — Encounter: Payer: 59 | Admitting: Advanced Practice Midwife
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
## 2021-05-26 ENCOUNTER — Other Ambulatory Visit: Payer: Self-pay

## 2021-05-26 ENCOUNTER — Encounter: Payer: Self-pay | Admitting: Obstetrics and Gynecology

## 2021-05-26 ENCOUNTER — Ambulatory Visit (INDEPENDENT_AMBULATORY_CARE_PROVIDER_SITE_OTHER): Payer: Medicaid Other | Admitting: Obstetrics and Gynecology

## 2021-05-26 ENCOUNTER — Other Ambulatory Visit (HOSPITAL_COMMUNITY)
Admission: RE | Admit: 2021-05-26 | Discharge: 2021-05-26 | Disposition: A | Discharge status: Home / Self Care | Payer: Medicaid Other | Source: Ambulatory Visit | Attending: Advanced Practice Midwife | Admitting: Advanced Practice Midwife

## 2021-05-26 VITALS — BP 128/73 | HR 100 | Wt 161.0 lb

## 2021-05-26 DIAGNOSIS — O98811 Other maternal infectious and parasitic diseases complicating pregnancy, first trimester: Secondary | ICD-10-CM

## 2021-05-26 DIAGNOSIS — Z3401 Encounter for supervision of normal first pregnancy, first trimester: Secondary | ICD-10-CM | POA: Diagnosis present

## 2021-05-26 DIAGNOSIS — Z3A16 16 weeks gestation of pregnancy: Secondary | ICD-10-CM | POA: Diagnosis not present

## 2021-05-26 DIAGNOSIS — A749 Chlamydial infection, unspecified: Secondary | ICD-10-CM

## 2021-05-26 NOTE — Patient Instructions (Signed)
Obstetrics: Normal and Problem Pregnancies (7th ed., pp. 102-121). [place], [place]: Elsevier."> Textbook of Family Medicine (9th ed., pp. 365-410). [place], [place]: [employer].">  First Trimester of Pregnancy  The first trimester of pregnancy starts on the first day of your last menstrual period until the end of week 12. This is months 1 through 3 of pregnancy. A week after a sperm fertilizes an egg, the egg will implant into the wall of the uterus and begin to develop into a baby. By the end of 12 weeks, all the baby'sorgans will be formed and the baby will be 2-3 inches in size. Body changes during your first trimester Your body goes through many changes during pregnancy. The changes vary andgenerally return to normal after your baby is born. Physical changes You may gain or lose weight. Your breasts may begin to grow larger and become tender. The tissue that surrounds your nipples (areola) may become darker. Dark spots or blotches (chloasma or mask of pregnancy) may develop on your face. You may have changes in your hair. These can include thickening or thinning of your hair or changes in texture. Health changes You may feel nauseous, and you may vomit. You may have heartburn. You may develop headaches. You may develop constipation. Your gums may bleed and may be sensitive to brushing and flossing. Other changes You may tire easily. You may urinate more often. Your menstrual periods will stop. You may have a loss of appetite. You may develop cravings for certain kinds of food. You may have changes in your emotions from day to day. You may have more vivid and strange dreams. Follow these instructions at home: Medicines Follow your health care provider's instructions regarding medicine use. Specific medicines may be either safe or unsafe to take during pregnancy. Do not take any medicines unless told to by your health care provider. Take a prenatal vitamin that contains at least  600 micrograms (mcg) of folic acid. Eating and drinking Eat a healthy diet that includes fresh fruits and vegetables, whole grains, good sources of protein such as meat, eggs, or tofu, and low-fat dairy products. Avoid raw meat and unpasteurized juice, milk, and cheese. These carry germs that can harm you and your baby. If you feel nauseous or you vomit: Eat 4 or 5 small meals a day instead of 3 large meals. Try eating a few soda crackers. Drink liquids between meals instead of during meals. You may need to take these actions to prevent or treat constipation: Drink enough fluid to keep your urine pale yellow. Eat foods that are high in fiber, such as beans, whole grains, and fresh fruits and vegetables. Limit foods that are high in fat and processed sugars, such as fried or sweet foods. Activity Exercise only as directed by your health care provider. Most people can continue their usual exercise routine during pregnancy. Try to exercise for 30 minutes at least 5 days a week. Stop exercising if you develop pain or cramping in the lower abdomen or lower back. Avoid exercising if it is very hot or humid or if you are at high altitude. Avoid heavy lifting. If you choose to, you may have sex unless your health care provider tells you not to. Relieving pain and discomfort Wear a good support bra to relieve breast tenderness. Rest with your legs elevated if you have leg cramps or low back pain. If you develop bulging veins (varicose veins) in your legs: Wear support hose as told by your health care provider. Elevate   your feet for 15 minutes, 3-4 times a day. Limit salt in your diet. Safety Wear your seat belt at all times when driving or riding in a car. Talk with your health care provider if someone is verbally or physically abusive to you. Talk with your health care provider if you are feeling sad or have thoughts of hurting yourself. Lifestyle Do not use hot tubs, steam rooms, or  saunas. Do not douche. Do not use tampons or scented sanitary pads. Do not use herbal remedies, alcohol, illegal drugs, or medicines that are not approved by your health care provider. Chemicals in these products can harm your baby. Do not use any products that contain nicotine or tobacco, such as cigarettes, e-cigarettes, and chewing tobacco. If you need help quitting, ask your health care provider. Avoid cat litter boxes and soil used by cats. These carry germs that can cause birth defects in the baby and possibly loss of the unborn baby (fetus) by miscarriage or stillbirth. General instructions During routine prenatal visits in the first trimester, your health care provider will do a physical exam, perform necessary tests, and ask you how things are going. Keep all follow-up visits. This is important. Ask for help if you have counseling or nutritional needs during pregnancy. Your health care provider can offer advice or refer you to specialists for help with various needs. Schedule a dentist appointment. At home, brush your teeth with a soft toothbrush. Floss gently. Write down your questions. Take them to your prenatal visits. Where to find more information American Pregnancy Association: americanpregnancy.org [employer]: https://www.todd-brady.net/ Office on [employer] Health: [employer] Contact a health care provider if you have: Dizziness. A fever. Mild pelvic cramps, pelvic pressure, or nagging pain in the abdominal area. Nausea, vomiting, or diarrhea that lasts for 24 hours or longer. A bad-smelling vaginal discharge. Pain when you urinate. Known exposure to a contagious illness, such as chickenpox, measles, Zika virus, HIV, or hepatitis. Get help right away if you have: Spotting or bleeding from your vagina. Severe abdominal cramping or pain. Shortness of breath or chest pain. Any kind of trauma, such as from a fall  or a car crash. New or increased pain, swelling, or redness in an arm or leg. Summary The first trimester of pregnancy starts on the first day of your last menstrual period until the end of week 12 (months 1 through 3). Eating 4 or 5 small meals a day rather than 3 large meals may help to relieve nausea and vomiting. Do not use any products that contain nicotine or tobacco, such as cigarettes, e-cigarettes, and chewing tobacco. If you need help quitting, ask your health care provider. Keep all follow-up visits. This is important. This information is not intended to replace advice given to you by your health care provider. Make sure you discuss any questions you have with your healthcare provider. Document Revised: 04/03/2020 Document Reviewed: 02/08/2020 Elsevier Patient Education  2022 [employer].  Second Trimester of Pregnancy  The second trimester of pregnancy is from week 13 through week 27. This is months 4 through 6 of pregnancy. The second trimester is often a time when you feel your best. Your body has adjusted to being pregnant, and you begin to feelbetter physically. During the second trimester: Morning sickness has lessened or stopped completely. You may have more energy. You may have an increase in appetite. The second trimester is also a time when the unborn baby (fetus) is growing rapidly. At the end  of the sixth month, the fetus may be up to 12 inches long and weigh about 1 pounds. You will likely begin to feel the baby move (quickening) between 16 and 20 weeks of pregnancy. Body changes during your second trimester Your body continues to go through many changes during your second trimester.The changes vary and generally return to normal after the baby is born. Physical changes Your weight will continue to increase. You will notice your lower abdomen bulging out. You may begin to get stretch marks on your hips, abdomen, and breasts. Your breasts will continue to grow and to  become tender. Dark spots or blotches (chloasma or mask of pregnancy) may develop on your face. A dark line from your belly button to the pubic area (linea nigra) may appear. You may have changes in your hair. These can include thickening of your hair, rapid growth, and changes in texture. Some people also have hair loss during or after pregnancy, or hair that feels dry or thin. Health changes You may develop headaches. You may have heartburn. You may develop constipation. You may develop hemorrhoids or swollen, bulging veins (varicose veins). Your gums may bleed and may be sensitive to brushing and flossing. You may urinate more often because the fetus is pressing on your bladder. You may have back pain. This is caused by: Weight gain. Pregnancy hormones that are relaxing the joints in your pelvis. A shift in weight and the muscles that support your balance. Follow these instructions at home: Medicines Follow your health care provider's instructions regarding medicine use. Specific medicines may be either safe or unsafe to take during pregnancy. Do not take any medicines unless approved by your health care provider. Take a prenatal vitamin that contains at least 600 micrograms (mcg) of folic acid. Eating and drinking Eat a healthy diet that includes fresh fruits and vegetables, whole grains, good sources of protein such as meat, eggs, or tofu, and low-fat dairy products. Avoid raw meat and unpasteurized juice, milk, and cheese. These carry germs that can harm you and your baby. You may need to take these actions to prevent or treat constipation: Drink enough fluid to keep your urine pale yellow. Eat foods that are high in fiber, such as beans, whole grains, and fresh fruits and vegetables. Limit foods that are high in fat and processed sugars, such as fried or sweet foods. Activity Exercise only as directed by your health care provider. Most people can continue their usual exercise  routine during pregnancy. Try to exercise for 30 minutes at least 5 days a week. Stop exercising if you develop contractions in your uterus. Stop exercising if you develop pain or cramping in the lower abdomen or lower back. Avoid exercising if it is very hot or humid or if you are at a high altitude. Avoid heavy lifting. If you choose to, you may have sex unless your health care provider tells you not to. Relieving pain and discomfort Wear a supportive bra to prevent discomfort from breast tenderness. Take warm sitz baths to soothe any pain or discomfort caused by hemorrhoids. Use hemorrhoid cream if your health care provider approves. Rest with your legs raised (elevated) if you have leg cramps or low back pain. If you develop varicose veins: Wear support hose as told by your health care provider. Elevate your feet for 15 minutes, 3-4 times a day. Limit salt in your diet. Safety Wear your seat belt at all times when driving or riding in a car. Talk with your  health care provider if someone is verbally or physically abusive to you. Lifestyle Do not use hot tubs, steam rooms, or saunas. Do not douche. Do not use tampons or scented sanitary pads. Avoid cat litter boxes and soil used by cats. These carry germs that can cause birth defects in the baby and possibly loss of the fetus by miscarriage or stillbirth. Do not use herbal remedies, alcohol, illegal drugs, or medicines that are not approved by your health care provider. Chemicals in these products can harm your baby. Do not use any products that contain nicotine or tobacco, such as cigarettes, e-cigarettes, and chewing tobacco. If you need help quitting, ask your health care provider. General instructions During a routine prenatal visit, your health care provider will do a physical exam and other tests. He or she will also discuss your overall health. Keep all follow-up visits. This is important. Ask your health care provider for a  referral to a local prenatal education class. Ask for help if you have counseling or nutritional needs during pregnancy. Your health care provider can offer advice or refer you to specialists for help with various needs. Where to find more information American Pregnancy Association: americanpregnancy.org [employer]: https://www.todd-brady.net/ Office on [employer] Health: [employer] Contact a health care provider if you have: A headache that does not go away when you take medicine. Vision changes or you see spots in front of your eyes. Mild pelvic cramps, pelvic pressure, or nagging pain in the abdominal area. Persistent nausea, vomiting, or diarrhea. A bad-smelling vaginal discharge or foul-smelling urine. Pain when you urinate. Sudden or extreme swelling of your face, hands, ankles, feet, or legs. A fever. Get help right away if you: Have fluid leaking from your vagina. Have spotting or bleeding from your vagina. Have severe abdominal cramping or pain. Have difficulty breathing. Have chest pain. Have fainting spells. Have not felt your baby move for the time period told by your health care provider. Have new or increased pain, swelling, or redness in an arm or leg. Summary The second trimester of pregnancy is from week 13 through week 27 (months 4 through 6). Do not use herbal remedies, alcohol, illegal drugs, or medicines that are not approved by your health care provider. Chemicals in these products can harm your baby. Exercise only as directed by your health care provider. Most people can continue their usual exercise routine during pregnancy. Keep all follow-up visits. This is important. This information is not intended to replace advice given to you by your health care provider. Make sure you discuss any questions you have with your healthcare provider. Document Revised: 04/03/2020 Document Reviewed:  02/08/2020 Elsevier Patient Education  2022 [employer].  Contraception Choices Contraception, also called birth control, refers to methods or devices thatprevent pregnancy. Hormonal methods  Contraceptive implant A contraceptive implant is a thin, plastic tube that contains a hormone that prevents pregnancy. It is different from an intrauterine device (IUD). It is inserted into the upper part of the arm by a health care provider. Implants canbe effective for up to 3 years. Progestin-only injections Progestin-only injections are injections of progestin, a synthetic form of thehormone progesterone. They are given every 3 months by a health care provider. Birth control pills Birth control pills are pills that contain hormones that prevent pregnancy. They must be taken once a day, preferably at the same time each day. Aprescription is needed to use this method of contraception. Birth control patch The birth control patch contains  hormones that prevent pregnancy. It is placed on the skin and must be changed once a week for three weeks and removed on thefourth week. A prescription is needed to use this method of contraception. Vaginal ring A vaginal ring contains hormones that prevent pregnancy. It is placed in the vagina for three weeks and removed on the fourth week. After that, the process is repeated with a new ring. A prescription is needed to use this method ofcontraception. Emergency contraceptive Emergency contraceptives prevent pregnancy after unprotected sex. They come in pill form and can be taken up to 5 days after sex. They work best the sooner they are taken after having sex. Most emergency contraceptives are available without a prescription. This method should not be used as your only form ofbirth control. Barrier methods  Female condom A female condom is a thin sheath that is worn over the penis during sex. Condoms keep sperm from going inside a woman's body. They can be used with a  sperm-killing substance (spermicide) to increase their effectiveness. They should be thrown away after one use. Female condom A female condom is a soft, loose-fitting sheath that is put into the vagina before sex. The condom keeps sperm from going inside a woman's body. Theyshould be thrown away after one use. Diaphragm A diaphragm is a soft, dome-shaped barrier. It is inserted into the vagina before sex, along with a spermicide. The diaphragm blocks sperm from entering the uterus, and the spermicide kills sperm. A diaphragm should be left in thevagina for 6-8 hours after sex and removed within 24 hours. A diaphragm is prescribed and fitted by a health care provider. A diaphragm should be replaced every 1-2 years, after giving birth, after gaining more than15 lb (6.8 kg), and after pelvic surgery. Cervical cap A cervical cap is a round, soft latex or plastic cup that fits over the cervix. It is inserted into the vagina before sex, along with spermicide. It blocks sperm from entering the uterus. The cap should be left in place for 6-8 hours after sex and removed within 48 hours. A cervical cap must be prescribed andfitted by a health care provider. It should be replaced every 2 years. Sponge A sponge is a soft, circular piece of polyurethane foam with spermicide in it. The sponge helps block sperm from entering the uterus, and the spermicide kills sperm. To use it, you make it wet and then insert it into the vagina. It should be inserted before sex, left in for at least 6 hours after sex, and removed andthrown away within 30 hours. Spermicides Spermicides are chemicals that kill or block sperm from entering the cervix and uterus. They can come as a cream, jelly, suppository, foam, or tablet. A spermicide should be inserted into the vagina with an applicator at least 10-15 minutes before sex to allow time for it to work. The process must be repeatedevery time you have sex. Spermicides do not require a  prescription. Intrauterine contraception Intrauterine device (IUD) An IUD is a T-shaped device that is put in a woman's uterus. There are two types: Hormone IUD.This type contains progestin, a synthetic form of the hormone progesterone. This type can stay in place for 3-5 years. Copper IUD.This type is wrapped in copper wire. It can stay in place for 10 years. Permanent methods of contraception Female tubal ligation In this method, a woman's fallopian tubes are sealed, tied, or blocked duringsurgery to prevent eggs from traveling to the uterus. Hysteroscopic sterilization In this method, a small,  flexible insert is placed into each fallopian tube. The inserts cause scar tissue to form in the fallopian tubes and block them, so sperm cannot reach an egg. The procedure takes about 3 months to be effective.Another form of birth control must be used during those 3 months. Female sterilization This is a procedure to tie off the tubes that carry sperm (vasectomy). After the procedure, the man can still ejaculate fluid (semen). Another form of birth control must be used for 3 months after the procedure. Natural planning methods Natural family planning In this method, a couple does not have sex on days when the woman could become pregnant. Calendar method In this method, the woman keeps track of the length of each menstrual cycle, identifies the days when pregnancy can happen, and does not have sex on those days. Ovulation method In this method, a couple avoids sex during ovulation. Symptothermal method This method involves not having sex during ovulation. The woman typically checks for ovulation bywatching changes in her temperature and in the consistency of cervical mucus. Post-ovulation method In this method, a couple waits to have sex until after ovulation. Where to find more information Centers for Disease Control and Prevention: www.cdc.gov Summary Contraception, also called birth control,  refers to methods or devices that prevent pregnancy. Hormonal methods of contraception include implants, injections, pills, patches, vaginal rings, and emergency contraceptives. Barrier methods of contraception can include female condoms, female condoms, diaphragms, cervical caps, sponges, and spermicides. There are two types of IUDs (intrauterine devices). An IUD can be put in a woman's uterus to prevent pregnancy for 3-5 years. Permanent sterilization can be done through a procedure for males and females. Natural family planning methods involve nothaving sex on days when the woman could become pregnant. This information is not intended to replace advice given to you by your health care provider. Make sure you discuss any questions you have with your healthcare provider. Document Revised: 04/01/2020 Document Reviewed: 04/01/2020 Elsevier Patient Education  2022 Elsevier Inc.  
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
## 2021-05-26 NOTE — Progress Notes (Signed)
Pt states she is having daily HA's, using Tylenol with some relief.  Pt is also having problems with constipation.

## 2021-05-27 DIAGNOSIS — A749 Chlamydial infection, unspecified: Secondary | ICD-10-CM

## 2021-05-27 HISTORY — DX: Chlamydial infection, unspecified: A74.9

## 2021-05-27 LAB — CERVICOVAGINAL ANCILLARY ONLY
Chlamydia: POSITIVE — AB
Comment: NEGATIVE
Comment: NORMAL
Neisseria Gonorrhea: NEGATIVE

## 2021-05-27 LAB — CYTOLOGY - PAP
Adequacy: ABSENT
Diagnosis: NEGATIVE

## 2021-05-27 MED ORDER — AZITHROMYCIN 500 MG PO TABS: 1000.0000 mg | ORAL_TABLET | Freq: Once | ORAL | 1 refills | Status: AC

## 2021-05-27 NOTE — Addendum Note (Signed)
Addended by: Catalina Antigua on: 05/27/2021 01:56 PM   Modules accepted: Orders

## 2021-05-28 LAB — AFP, SERUM, OPEN SPINA BIFIDA
AFP MoM: 0.96
AFP Value: 33.3 ng/mL
Gest. Age on Collection Date: 16.3 weeks
Maternal Age At EDD: 23.8 yr
OSBR Risk 1 IN: 10000
Test Results:: NEGATIVE
Weight: 161 [lb_av]

## 2021-05-28 LAB — CULTURE, OB URINE

## 2021-05-28 LAB — HCV INTERPRETATION

## 2021-05-28 LAB — CBC/D/PLT+RPR+RH+ABO+RUBIGG...
Antibody Screen: NEGATIVE
Basophils Absolute: 0 10*3/uL (ref 0.0–0.2)
Basos: 1 %
EOS (ABSOLUTE): 0 10*3/uL (ref 0.0–0.4)
Eos: 0 %
HCV Ab: <0.1 s/co ratio (ref 0.0–0.9)
HIV Screen 4th Generation wRfx: NONREACTIVE
Hematocrit: 36.5 % (ref 34.0–46.6)
Hemoglobin: 12 g/dL (ref 11.1–15.9)
Hepatitis B Surface Ag: NEGATIVE
Immature Grans (Abs): 0 10*3/uL (ref 0.0–0.1)
Immature Granulocytes: 0 %
Lymphocytes Absolute: 1.8 10*3/uL (ref 0.7–3.1)
Lymphs: 22 %
MCH: 26.2 pg — ABNORMAL LOW (ref 26.6–33.0)
MCHC: 32.9 g/dL (ref 31.5–35.7)
MCV: 80 fL (ref 79–97)
Monocytes Absolute: 0.6 10*3/uL (ref 0.1–0.9)
Monocytes: 7 %
Neutrophils Absolute: 5.8 10*3/uL (ref 1.4–7.0)
Neutrophils: 70 %
Platelets: 270 10*3/uL (ref 150–450)
RBC: 4.58 x10E6/uL (ref 3.77–5.28)
RDW: 16.6 % — ABNORMAL HIGH (ref 11.7–15.4)
RPR Ser Ql: NONREACTIVE
Rh Factor: POSITIVE
Rubella Antibodies, IGG: 2.58 index (ref >0.99)
WBC: 8.3 10*3/uL (ref 3.4–10.8)

## 2021-05-28 LAB — URINE CULTURE, OB REFLEX

## 2021-06-05 ENCOUNTER — Encounter: Payer: Self-pay | Admitting: Obstetrics and Gynecology

## 2021-06-08 ENCOUNTER — Other Ambulatory Visit: Payer: Self-pay

## 2021-06-09 MED ORDER — PANTOPRAZOLE SODIUM 20 MG PO TBEC: 20.0000 mg | DELAYED_RELEASE_TABLET | Freq: Every day | ORAL | 0 refills | Status: DC

## 2021-06-23 ENCOUNTER — Encounter: Payer: Medicaid Other | Admitting: Advanced Practice Midwife

## 2021-06-25 ENCOUNTER — Other Ambulatory Visit: Payer: Self-pay

## 2021-06-25 ENCOUNTER — Ambulatory Visit: Payer: Medicaid Other | Attending: Obstetrics and Gynecology

## 2021-06-25 DIAGNOSIS — Z3401 Encounter for supervision of normal first pregnancy, first trimester: Secondary | ICD-10-CM | POA: Insufficient documentation

## 2021-06-26 ENCOUNTER — Other Ambulatory Visit: Payer: Self-pay | Admitting: *Deleted

## 2021-06-26 ENCOUNTER — Telehealth: Payer: Self-pay | Admitting: Obstetrics and Gynecology

## 2021-06-26 DIAGNOSIS — Z362 Encounter for other antenatal screening follow-up: Secondary | ICD-10-CM

## 2021-06-26 NOTE — Telephone Encounter (Signed)
I called Ms. Plake in follow up to our conversation yesterday about paternity testing in pregnancy.  We had spoken about invasive testing through amniocentesis, including the risks, benefits and cost of this type of testing.  Today, I was able to speak with a lab who offers non invasive paternity testing during pregnancy.  This would be performed on a blood sample similar to cell free fetal DNA testing for chromosome conditions.  I provided the patient with the contact number and name of the lab for her information ([facility], [phone] for prenatal noninvasive paternity testing, [phone]).  Cherly Anderson, MS, CGC

## 2021-06-30 ENCOUNTER — Ambulatory Visit (INDEPENDENT_AMBULATORY_CARE_PROVIDER_SITE_OTHER): Payer: Medicaid Other | Admitting: Advanced Practice Midwife

## 2021-06-30 ENCOUNTER — Other Ambulatory Visit: Payer: Self-pay

## 2021-06-30 VITALS — BP 112/73 | HR 93 | Wt 156.5 lb

## 2021-06-30 DIAGNOSIS — R109 Unspecified abdominal pain: Secondary | ICD-10-CM

## 2021-06-30 DIAGNOSIS — R12 Heartburn: Secondary | ICD-10-CM

## 2021-06-30 DIAGNOSIS — A749 Chlamydial infection, unspecified: Secondary | ICD-10-CM

## 2021-06-30 DIAGNOSIS — Z3401 Encounter for supervision of normal first pregnancy, first trimester: Secondary | ICD-10-CM

## 2021-06-30 DIAGNOSIS — O99612 Diseases of the digestive system complicating pregnancy, second trimester: Secondary | ICD-10-CM

## 2021-06-30 DIAGNOSIS — K5900 Constipation, unspecified: Secondary | ICD-10-CM

## 2021-06-30 DIAGNOSIS — O98811 Other maternal infectious and parasitic diseases complicating pregnancy, first trimester: Secondary | ICD-10-CM

## 2021-06-30 DIAGNOSIS — O26892 Other specified pregnancy related conditions, second trimester: Secondary | ICD-10-CM

## 2021-06-30 MED ORDER — POLYETHYLENE GLYCOL 3350 17 GM/SCOOP PO POWD: 17.0000 g | Freq: Every day | ORAL | 0 refills | Status: DC

## 2021-06-30 MED ORDER — AZITHROMYCIN 500 MG PO TABS: 1000.0000 mg | ORAL_TABLET | Freq: Once | ORAL | 1 refills | Status: AC

## 2021-06-30 MED ORDER — DOCUSATE SODIUM 100 MG PO CAPS: 100.0000 mg | ORAL_CAPSULE | Freq: Two times a day (BID) | ORAL | 2 refills | Status: DC | PRN
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
## 2021-07-05 ENCOUNTER — Other Ambulatory Visit: Payer: Self-pay | Admitting: Obstetrics

## 2021-07-24 ENCOUNTER — Telehealth: Payer: Self-pay

## 2021-07-24 NOTE — Telephone Encounter (Signed)
Spoke with patient about rescheduling to next Wednesday.  Patient starting a new job next week, so she declined.  Explained that there will be a wait tomorrow (9/16)

## 2021-07-25 ENCOUNTER — Encounter: Payer: Self-pay | Admitting: *Deleted

## 2021-07-25 ENCOUNTER — Ambulatory Visit: Payer: Medicaid Other | Attending: Obstetrics

## 2021-07-25 ENCOUNTER — Other Ambulatory Visit: Payer: Self-pay

## 2021-07-25 ENCOUNTER — Ambulatory Visit: Payer: Medicaid Other | Admitting: *Deleted

## 2021-07-25 VITALS — BP 103/67 | HR 117

## 2021-07-25 DIAGNOSIS — Z3A25 25 weeks gestation of pregnancy: Secondary | ICD-10-CM

## 2021-07-25 DIAGNOSIS — Z362 Encounter for other antenatal screening follow-up: Secondary | ICD-10-CM | POA: Insufficient documentation

## 2021-07-28 ENCOUNTER — Encounter: Payer: Medicaid Other | Admitting: Advanced Practice Midwife
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
## 2021-08-05 ENCOUNTER — Telehealth (INDEPENDENT_AMBULATORY_CARE_PROVIDER_SITE_OTHER): Payer: Medicaid Other | Admitting: Advanced Practice Midwife

## 2021-08-05 DIAGNOSIS — A568 Sexually transmitted chlamydial infection of other sites: Secondary | ICD-10-CM

## 2021-08-05 DIAGNOSIS — O98312 Other infections with a predominantly sexual mode of transmission complicating pregnancy, second trimester: Secondary | ICD-10-CM

## 2021-08-05 DIAGNOSIS — O98811 Other maternal infectious and parasitic diseases complicating pregnancy, first trimester: Secondary | ICD-10-CM

## 2021-08-05 DIAGNOSIS — Z3401 Encounter for supervision of normal first pregnancy, first trimester: Secondary | ICD-10-CM

## 2021-08-05 DIAGNOSIS — O26892 Other specified pregnancy related conditions, second trimester: Secondary | ICD-10-CM

## 2021-08-05 DIAGNOSIS — U071 COVID-19: Secondary | ICD-10-CM | POA: Insufficient documentation

## 2021-08-05 DIAGNOSIS — R109 Unspecified abdominal pain: Secondary | ICD-10-CM

## 2021-08-05 DIAGNOSIS — Z3A26 26 weeks gestation of pregnancy: Secondary | ICD-10-CM

## 2021-08-05 DIAGNOSIS — O98512 Other viral diseases complicating pregnancy, second trimester: Secondary | ICD-10-CM
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
## 2021-08-05 NOTE — Progress Notes (Signed)
Virtual ROB [redacted]w[redacted]d  CC:  None

## 2021-08-15 MED ORDER — PANTOPRAZOLE SODIUM 20 MG PO TBEC: 40.0000 mg | DELAYED_RELEASE_TABLET | Freq: Every day | ORAL | 4 refills | Status: DC

## 2021-08-26 ENCOUNTER — Other Ambulatory Visit: Payer: Medicaid Other

## 2021-08-26 ENCOUNTER — Ambulatory Visit (INDEPENDENT_AMBULATORY_CARE_PROVIDER_SITE_OTHER): Payer: Medicaid Other | Admitting: Advanced Practice Midwife

## 2021-08-26 ENCOUNTER — Other Ambulatory Visit (HOSPITAL_COMMUNITY)
Admission: RE | Admit: 2021-08-26 | Discharge: 2021-08-26 | Disposition: A | Discharge status: Home / Self Care | Payer: Medicaid Other | Source: Ambulatory Visit | Attending: Advanced Practice Midwife | Admitting: Advanced Practice Midwife

## 2021-08-26 ENCOUNTER — Other Ambulatory Visit: Payer: Self-pay | Admitting: Advanced Practice Midwife

## 2021-08-26 ENCOUNTER — Other Ambulatory Visit: Payer: Self-pay

## 2021-08-26 VITALS — BP 110/74 | HR 107 | Wt 176.6 lb

## 2021-08-26 DIAGNOSIS — A749 Chlamydial infection, unspecified: Secondary | ICD-10-CM

## 2021-08-26 DIAGNOSIS — O98812 Other maternal infectious and parasitic diseases complicating pregnancy, second trimester: Secondary | ICD-10-CM

## 2021-08-26 DIAGNOSIS — O26893 Other specified pregnancy related conditions, third trimester: Secondary | ICD-10-CM

## 2021-08-26 DIAGNOSIS — B3731 Acute candidiasis of vulva and vagina: Secondary | ICD-10-CM

## 2021-08-26 DIAGNOSIS — Z23 Encounter for immunization: Secondary | ICD-10-CM | POA: Diagnosis not present

## 2021-08-26 DIAGNOSIS — Z3401 Encounter for supervision of normal first pregnancy, first trimester: Secondary | ICD-10-CM

## 2021-08-26 DIAGNOSIS — R109 Unspecified abdominal pain: Secondary | ICD-10-CM

## 2021-08-26 DIAGNOSIS — U071 COVID-19: Secondary | ICD-10-CM

## 2021-08-26 DIAGNOSIS — R519 Headache, unspecified: Secondary | ICD-10-CM

## 2021-08-26 DIAGNOSIS — O26899 Other specified pregnancy related conditions, unspecified trimester: Secondary | ICD-10-CM

## 2021-08-26 DIAGNOSIS — R21 Rash and other nonspecific skin eruption: Secondary | ICD-10-CM

## 2021-08-26 DIAGNOSIS — O98512 Other viral diseases complicating pregnancy, second trimester: Secondary | ICD-10-CM

## 2021-08-26 DIAGNOSIS — Z3A29 29 weeks gestation of pregnancy: Secondary | ICD-10-CM

## 2021-08-26 MED ORDER — BUTALBITAL-APAP-CAFFEINE 50-325-40 MG PO TABS: 1.0000 | ORAL_TABLET | Freq: Four times a day (QID) | ORAL | 0 refills | Status: DC | PRN

## 2021-08-26 MED ORDER — FLUCONAZOLE 150 MG PO TABS: ORAL_TABLET | ORAL | 0 refills | Status: DC

## 2021-08-26 MED ORDER — NYSTATIN 100000 UNIT/GM EX CREA
1.0000 | TOPICAL_CREAM | Freq: Two times a day (BID) | CUTANEOUS | 1 refills | Status: DC
Start: 2021-08-26 — End: 2021-11-06

## 2021-08-26 MED ORDER — TRIAMCINOLONE ACETONIDE 0.5 % EX OINT: 1.0000 "application " | TOPICAL_OINTMENT | Freq: Two times a day (BID) | CUTANEOUS | 0 refills | Status: DC
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
## 2021-08-26 NOTE — Progress Notes (Signed)
ROB 29.4wks GTT, CBC, HIV, RPR TDAP offered and accepted Depression and anxiety screen   2 Episodes of prolonged cramping during the night, lasting 20 minutes each, reports pain is more severe than period pain.  Frequent headaches. Wants to know if can take something besides tylenol. Feels like she is taking it a lot.  Continued constipation.

## 2021-08-27 ENCOUNTER — Other Ambulatory Visit: Payer: Self-pay | Admitting: Advanced Practice Midwife

## 2021-08-27 DIAGNOSIS — Z3401 Encounter for supervision of normal first pregnancy, first trimester: Secondary | ICD-10-CM

## 2021-08-27 DIAGNOSIS — O99013 Anemia complicating pregnancy, third trimester: Secondary | ICD-10-CM

## 2021-08-27 LAB — RPR: RPR Ser Ql: NONREACTIVE

## 2021-08-27 LAB — CBC
Hematocrit: 30.5 % — ABNORMAL LOW (ref 34.0–46.6)
Hemoglobin: 9.6 g/dL — ABNORMAL LOW (ref 11.1–15.9)
MCH: 24.7 pg — ABNORMAL LOW (ref 26.6–33.0)
MCHC: 31.5 g/dL (ref 31.5–35.7)
MCV: 78 fL — ABNORMAL LOW (ref 79–97)
Platelets: 295 10*3/uL (ref 150–450)
RBC: 3.89 x10E6/uL (ref 3.77–5.28)
RDW: 12.9 % (ref 11.7–15.4)
WBC: 8.3 10*3/uL (ref 3.4–10.8)

## 2021-08-27 LAB — CERVICOVAGINAL ANCILLARY ONLY
Chlamydia: NEGATIVE
Comment: NEGATIVE
Comment: NEGATIVE
Comment: NORMAL
Neisseria Gonorrhea: NEGATIVE
Trichomonas: NEGATIVE

## 2021-08-27 LAB — GLUCOSE TOLERANCE, 2 HOURS W/ 1HR
Glucose, 1 hour: 158 mg/dL (ref 70–179)
Glucose, 2 hour: 104 mg/dL (ref 70–152)
Glucose, Fasting: 91 mg/dL (ref 70–91)

## 2021-08-27 LAB — HIV ANTIBODY (ROUTINE TESTING W REFLEX): HIV Screen 4th Generation wRfx: NONREACTIVE

## 2021-08-27 MED ORDER — FERROUS SULFATE 325 (65 FE) MG PO TABS: 325.0000 mg | ORAL_TABLET | ORAL | 1 refills | Status: DC

## 2021-08-28 LAB — HSV DNA BY PCR (REFERENCE LAB)
HSV 1 DNA: NEGATIVE
HSV 2 DNA: NEGATIVE

## 2021-09-09 ENCOUNTER — Other Ambulatory Visit: Payer: Self-pay

## 2021-09-09 ENCOUNTER — Ambulatory Visit (INDEPENDENT_AMBULATORY_CARE_PROVIDER_SITE_OTHER): Payer: Medicaid Other | Admitting: Licensed Clinical Social Worker

## 2021-09-09 ENCOUNTER — Ambulatory Visit (INDEPENDENT_AMBULATORY_CARE_PROVIDER_SITE_OTHER): Payer: Medicaid Other

## 2021-09-09 VITALS — BP 110/73 | HR 85 | Wt 183.0 lb

## 2021-09-09 DIAGNOSIS — O99013 Anemia complicating pregnancy, third trimester: Secondary | ICD-10-CM

## 2021-09-09 DIAGNOSIS — Z3403 Encounter for supervision of normal first pregnancy, third trimester: Secondary | ICD-10-CM

## 2021-09-09 DIAGNOSIS — Z8659 Personal history of other mental and behavioral disorders: Secondary | ICD-10-CM | POA: Diagnosis not present

## 2021-09-09 DIAGNOSIS — Z3A31 31 weeks gestation of pregnancy: Secondary | ICD-10-CM
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
## 2021-09-10 NOTE — BH Specialist Note (Signed)
Integrated Behavioral Health Initial In-Person Visit  MRN: [id number] Name: Michelle Bird  Number of Integrated Behavioral Health Clinician visits:: 1/6 Session Start time: 3:25pm  Session End time: 4:11pm Total time: 46 minutes in person at [facility]   Types of Service: Individual psychotherapy  Interpretor:No. Interpretor Name and Language: none   Warm Hand Off Completed.        Subjective: Michelle Bird is a 25 y.o. female accompanied by n/a Patient reports the following symptoms/concerns: stress regarding paternity and history of depression Duration of problem: approx 3 months ; Severity of problem: mild  Objective: Mood: good and Affect: Appropriate Risk of harm to self or others: No plan to harm self or others  Life Context: Family and Social: Lives with father and stepmother  School/Work: n/a Self-Care: n/a Life Changes: New pregnancy  Patient and/or Family's Strengths/Protective Factors: Concrete supports in place (healthy food, safe environments, etc.)  Goals Addressed: Patient will: Reduce symptoms of: depression Increase knowledge and/or ability of: coping skills  Demonstrate ability to: Increase healthy adjustment to current life circumstances  Progress towards Goals: Ongoing  Interventions: Interventions utilized: Supportive Counseling  Standardized Assessments completed: n/a  Patient and/or Family Response: Michelle Bird reports increase stress due to uncertainty regarding paternity of baby. Michelle Bird reports history of depression and family conflict.     Assessment: Patient history of depression.   Patient may benefit from integrated behavioral health.  Plan: Follow up with behavioral health clinician on : as needed Behavioral recommendations: Contact child support for information regarding potential case and paternity. Referral(s): Integrated [employer] (In Clinic) "From scale of 1-10, how likely are you to follow  plan?":   Michelle Saxon, LCSW

## 2021-09-23 ENCOUNTER — Ambulatory Visit (INDEPENDENT_AMBULATORY_CARE_PROVIDER_SITE_OTHER): Payer: Medicaid Other | Admitting: Obstetrics and Gynecology

## 2021-09-23 ENCOUNTER — Encounter: Payer: Self-pay | Admitting: Obstetrics and Gynecology

## 2021-09-23 ENCOUNTER — Other Ambulatory Visit: Payer: Self-pay

## 2021-09-23 VITALS — BP 124/87 | HR 110 | Wt 190.0 lb

## 2021-09-23 DIAGNOSIS — Z3A33 33 weeks gestation of pregnancy: Secondary | ICD-10-CM

## 2021-09-23 DIAGNOSIS — Z3401 Encounter for supervision of normal first pregnancy, first trimester: Secondary | ICD-10-CM
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
## 2021-09-23 NOTE — Progress Notes (Signed)
Pt states she is having lower back and hip pain.  Pt is having increase in heartburn/indegestion- has increase Rx.

## 2021-09-30 ENCOUNTER — Telehealth: Payer: Self-pay | Admitting: Radiology

## 2021-09-30 NOTE — Telephone Encounter (Signed)
Left message for Patient to call [facility] to schedule New Headache appt from referral.

## 2021-10-07 ENCOUNTER — Ambulatory Visit (INDEPENDENT_AMBULATORY_CARE_PROVIDER_SITE_OTHER): Payer: Medicaid Other | Admitting: Family Medicine

## 2021-10-07 ENCOUNTER — Other Ambulatory Visit: Payer: Self-pay

## 2021-10-07 ENCOUNTER — Encounter: Payer: Self-pay | Admitting: Obstetrics

## 2021-10-07 VITALS — BP 114/74 | HR 103 | Wt 192.0 lb

## 2021-10-07 DIAGNOSIS — Z3401 Encounter for supervision of normal first pregnancy, first trimester: Secondary | ICD-10-CM

## 2021-10-07 DIAGNOSIS — Z3A35 35 weeks gestation of pregnancy: Secondary | ICD-10-CM
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
## 2021-10-14 ENCOUNTER — Ambulatory Visit (INDEPENDENT_AMBULATORY_CARE_PROVIDER_SITE_OTHER): Payer: Medicaid Other | Admitting: Nurse Practitioner

## 2021-10-14 ENCOUNTER — Other Ambulatory Visit: Payer: Self-pay

## 2021-10-14 ENCOUNTER — Other Ambulatory Visit (HOSPITAL_COMMUNITY)
Admission: RE | Admit: 2021-10-14 | Discharge: 2021-10-14 | Disposition: A | Discharge status: Home / Self Care | Payer: Medicaid Other | Source: Ambulatory Visit | Attending: Obstetrics | Admitting: Obstetrics

## 2021-10-14 VITALS — BP 124/84 | HR 97 | Wt 197.0 lb

## 2021-10-14 DIAGNOSIS — Z3401 Encounter for supervision of normal first pregnancy, first trimester: Secondary | ICD-10-CM | POA: Insufficient documentation

## 2021-10-14 DIAGNOSIS — B3731 Acute candidiasis of vulva and vagina: Secondary | ICD-10-CM

## 2021-10-14 DIAGNOSIS — Z3A36 36 weeks gestation of pregnancy: Secondary | ICD-10-CM

## 2021-10-14 MED ORDER — TERCONAZOLE 0.4 % VA CREA: 1.0000 | TOPICAL_CREAM | Freq: Every day | VAGINAL | 1 refills | Status: AC
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
## 2021-10-14 NOTE — Progress Notes (Deleted)
[id number]

## 2021-10-15 LAB — CERVICOVAGINAL ANCILLARY ONLY
Chlamydia: NEGATIVE
Comment: NEGATIVE
Comment: NORMAL
Neisseria Gonorrhea: NEGATIVE

## 2021-10-18 LAB — CULTURE, BETA STREP (GROUP B ONLY): Strep Gp B Culture: NEGATIVE

## 2021-10-27 ENCOUNTER — Other Ambulatory Visit: Payer: Self-pay | Admitting: *Deleted

## 2021-10-27 ENCOUNTER — Encounter: Payer: Self-pay | Admitting: Advanced Practice Midwife

## 2021-10-27 MED ORDER — PANTOPRAZOLE SODIUM 20 MG PO TBEC: 40.0000 mg | DELAYED_RELEASE_TABLET | Freq: Every day | ORAL | 4 refills | Status: DC

## 2021-10-27 NOTE — Progress Notes (Signed)
Pt sent mychart msg for refill on heartburn Rx. Refill sent today.

## 2021-10-28 ENCOUNTER — Encounter: Payer: Self-pay | Admitting: Family Medicine

## 2021-10-28 ENCOUNTER — Ambulatory Visit (INDEPENDENT_AMBULATORY_CARE_PROVIDER_SITE_OTHER): Payer: Medicaid Other | Admitting: Family Medicine

## 2021-10-28 ENCOUNTER — Other Ambulatory Visit: Payer: Self-pay

## 2021-10-28 VITALS — BP 133/86 | HR 102 | Wt 203.4 lb

## 2021-10-28 DIAGNOSIS — L304 Erythema intertrigo: Secondary | ICD-10-CM | POA: Insufficient documentation

## 2021-10-28 DIAGNOSIS — Z3A38 38 weeks gestation of pregnancy: Secondary | ICD-10-CM

## 2021-10-28 DIAGNOSIS — B3731 Acute candidiasis of vulva and vagina: Secondary | ICD-10-CM

## 2021-10-28 DIAGNOSIS — Z3403 Encounter for supervision of normal first pregnancy, third trimester: Secondary | ICD-10-CM

## 2021-10-28 MED ORDER — KETOCONAZOLE 2 % EX CREA: 1.0000 "application " | TOPICAL_CREAM | Freq: Every day | CUTANEOUS | 0 refills | Status: DC

## 2021-10-28 MED ORDER — FLUCONAZOLE 150 MG PO TABS: ORAL_TABLET | ORAL | 0 refills | Status: DC

## 2021-10-28 NOTE — Progress Notes (Signed)
Pt c/o vaginal irritation.  No relief with creams/ointment

## 2021-10-28 NOTE — Progress Notes (Addendum)
° ° °  Subjective:  Michelle Bird is a 25 y.o. G1P0 at [redacted]w[redacted]d being seen today for ongoing prenatal care.  She is currently monitored for the following issues for this low-risk pregnancy and has Adjustment disorder with mixed anxiety and depressed mood; Vitamin D deficiency; Encounter for supervision of normal first pregnancy in third trimester; Chlamydia infection affecting pregnancy in first trimester; and COVID-19 affecting pregnancy in second trimester on their problem list.  Patient reports vaginal irritation.  She has had vulvovaginal candidiasis for the past several months, recurrent in the setting of vaginal terazol, nystatin, steroid cream, and oral diflucan. She recently has been using aquaphor as a barrier which has helped. Oral diflucan helped her vaginal symptoms previously but not the skin. She prefers an oral medication at this point if possible.  Contractions: Irritability. Vag. Bleeding: None.  Movement: Present. Denies leaking of fluid.   The following portions of the patient's history were reviewed and updated as appropriate: allergies, current medications, past family history, past medical history, past social history, past surgical history and problem list.   Objective:   Vitals:   10/28/21 1544  BP: 133/86  Pulse: (!) 102  Weight: 203 lb 6.4 oz (92.3 kg)    Fetal Status: Fetal Heart Rate (bpm): 150 Fundal Height: 38 cm Movement: Present  Presentation: Vertex  General:  Alert, oriented and cooperative. Patient is in no acute distress.  Skin: Skin is warm and dry. No rash noted.   Cardiovascular: Normal heart rate noted  Respiratory: Normal respiratory effort, no problems with respiration noted  Abdomen: Soft, gravid, appropriate for gestational age. Pain/Pressure: Present     Pelvic:  Cervical exam performed in the presence of a chaperone Dilation: 1.5 Effacement (%): 70 Station: -3 Coalesced erythematous rash present over bilateral labia minora and majora extending into  groin and including inguinal folds. Satellite lesions present. Few areas with white flaking present. White thick vaginal discharge present from the vaginal canal. Sensitive to palpation of rash.   Extremities: Normal range of motion.  Edema: None  Mental Status: Normal mood and affect. Normal behavior. Normal judgment and thought content.    Assessment and Plan:  Pregnancy: G1P0 at [redacted]w[redacted]d  1. Encounter for supervision of normal first pregnancy in third trimester Doing well with normal fetal movement.   2. [redacted] weeks gestation of pregnancy MAU precautions discussed.   3. Vulvovaginal candidiasis + Intertrigo  Extensive involving vaginal mucosa and vulva/groin including folds with satellite lesions. Given distribution, plan to treat with oral + topical ointment. Discussed preferred treatment of vaginal creams, however with severity and refractory to recent vaginal inserts, believe benefits outweigh the risks of diflucan at 38 weeks. Pt is aware. Encouraged keeping outside skin as dry as possible, cont aquaphor for barrier.  - fluconazole (DIFLUCAN) 150 MG tablet; Take one tablet now and one in 3 days.  Dispense: 2 tablet; Refill: 0 - ketoconazole (NIZORAL) 2 % cream; Apply 1 application topically daily for 14 days.  Dispense: 60 g; Refill: 0   Term labor symptoms and general obstetric precautions including but not limited to vaginal bleeding, contractions, leaking of fluid and fetal movement were reviewed in detail with the patient. Please refer to After Visit Summary for other counseling recommendations.   Return in about 1 week (around 11/04/2021) for LROB.   Allayne Stack, DO

## 2021-11-04 ENCOUNTER — Inpatient Hospital Stay (HOSPITAL_COMMUNITY): Payer: Medicaid Other | Admitting: Anesthesiology

## 2021-11-04 ENCOUNTER — Other Ambulatory Visit: Payer: Self-pay | Admitting: Obstetrics and Gynecology

## 2021-11-04 ENCOUNTER — Inpatient Hospital Stay (HOSPITAL_COMMUNITY)
Admission: AD | Admit: 2021-11-04 | Discharge: 2021-11-06 | DRG: 806 | Disposition: A | Discharge status: Home / Self Care | Payer: Medicaid Other | Attending: Obstetrics and Gynecology | Admitting: Obstetrics and Gynecology

## 2021-11-04 ENCOUNTER — Other Ambulatory Visit: Payer: Self-pay

## 2021-11-04 ENCOUNTER — Encounter (HOSPITAL_COMMUNITY): Payer: Self-pay | Admitting: Family Medicine

## 2021-11-04 DIAGNOSIS — Z20822 Contact with and (suspected) exposure to covid-19: Secondary | ICD-10-CM | POA: Diagnosis present

## 2021-11-04 DIAGNOSIS — Z3403 Encounter for supervision of normal first pregnancy, third trimester: Secondary | ICD-10-CM

## 2021-11-04 DIAGNOSIS — K219 Gastro-esophageal reflux disease without esophagitis: Secondary | ICD-10-CM | POA: Diagnosis present

## 2021-11-04 DIAGNOSIS — O99619 Diseases of the digestive system complicating pregnancy, unspecified trimester: Secondary | ICD-10-CM | POA: Diagnosis present

## 2021-11-04 DIAGNOSIS — F4323 Adjustment disorder with mixed anxiety and depressed mood: Secondary | ICD-10-CM | POA: Diagnosis present

## 2021-11-04 DIAGNOSIS — Z8616 Personal history of COVID-19: Secondary | ICD-10-CM

## 2021-11-04 DIAGNOSIS — O99354 Diseases of the nervous system complicating childbirth: Secondary | ICD-10-CM | POA: Diagnosis present

## 2021-11-04 DIAGNOSIS — G43909 Migraine, unspecified, not intractable, without status migrainosus: Secondary | ICD-10-CM | POA: Diagnosis present

## 2021-11-04 DIAGNOSIS — Z3A39 39 weeks gestation of pregnancy: Secondary | ICD-10-CM | POA: Diagnosis not present

## 2021-11-04 DIAGNOSIS — U071 COVID-19: Secondary | ICD-10-CM | POA: Diagnosis present

## 2021-11-04 DIAGNOSIS — A749 Chlamydial infection, unspecified: Secondary | ICD-10-CM | POA: Diagnosis present

## 2021-11-04 DIAGNOSIS — O26893 Other specified pregnancy related conditions, third trimester: Secondary | ICD-10-CM | POA: Diagnosis present

## 2021-11-04 LAB — CBC
HCT: 32.7 % — ABNORMAL LOW (ref 36.0–46.0)
Hemoglobin: 10 g/dL — ABNORMAL LOW (ref 12.0–15.0)
MCH: 22.9 pg — ABNORMAL LOW (ref 26.0–34.0)
MCHC: 30.6 g/dL (ref 30.0–36.0)
MCV: 74.8 fL — ABNORMAL LOW (ref 80.0–100.0)
Platelets: 336 10*3/uL (ref 150–400)
RBC: 4.37 MIL/uL (ref 3.87–5.11)
RDW: 15 % (ref 11.5–15.5)
WBC: 11.6 10*3/uL — ABNORMAL HIGH (ref 4.0–10.5)
nRBC: 0 % (ref 0.0–0.2)

## 2021-11-04 LAB — RESP PANEL BY RT-PCR (FLU A&B, COVID) ARPGX2
Influenza A by PCR: NEGATIVE
Influenza B by PCR: NEGATIVE
SARS Coronavirus 2 by RT PCR: NEGATIVE

## 2021-11-04 MED ORDER — LIDOCAINE HCL (PF) 1 % IJ SOLN
30.0000 mL | INTRAMUSCULAR | Status: AC | PRN
  Administered 2021-11-04: 21:00:00 30 mL via SUBCUTANEOUS
  Filled 2021-11-04: qty 30

## 2021-11-04 MED ORDER — FERROUS SULFATE 325 (65 FE) MG PO TABS
325.0000 mg | ORAL_TABLET | ORAL | Status: DC
  Administered 2021-11-05: 09:00:00 325 mg via ORAL
  Filled 2021-11-04: qty 1

## 2021-11-04 MED ORDER — COCONUT OIL OIL: 1.0000 "application " | TOPICAL_OIL | Status: DC | PRN

## 2021-11-04 MED ORDER — FENTANYL CITRATE (PF) 100 MCG/2ML IJ SOLN
50.0000 ug | INTRAMUSCULAR | Status: DC | PRN
  Administered 2021-11-04: 19:00:00 100 ug via INTRAVENOUS
  Filled 2021-11-04: qty 2

## 2021-11-04 MED ORDER — PRENATAL MULTIVITAMIN CH
1.0000 | ORAL_TABLET | Freq: Every day | ORAL | Status: DC
  Administered 2021-11-05: 16:00:00 1 via ORAL
  Filled 2021-11-04: qty 1

## 2021-11-04 MED ORDER — SENNOSIDES-DOCUSATE SODIUM 8.6-50 MG PO TABS
2.0000 | ORAL_TABLET | ORAL | Status: DC
  Administered 2021-11-05: 01:00:00 2 via ORAL
  Filled 2021-11-04: qty 2

## 2021-11-04 MED ORDER — ONDANSETRON HCL 4 MG/2ML IJ SOLN: 4.0000 mg | Freq: Four times a day (QID) | INTRAMUSCULAR | Status: DC | PRN

## 2021-11-04 MED ORDER — OXYCODONE-ACETAMINOPHEN 5-325 MG PO TABS: 2.0000 | ORAL_TABLET | ORAL | Status: DC | PRN

## 2021-11-04 MED ORDER — DOCUSATE SODIUM 100 MG PO CAPS
100.0000 mg | ORAL_CAPSULE | Freq: Two times a day (BID) | ORAL | Status: DC
  Administered 2021-11-05 – 2021-11-06 (×3): 100 mg via ORAL
  Filled 2021-11-04 (×3): qty 1

## 2021-11-04 MED ORDER — ACETAMINOPHEN 325 MG PO TABS: 650.0000 mg | ORAL_TABLET | ORAL | Status: DC | PRN

## 2021-11-04 MED ORDER — FENTANYL-BUPIVACAINE-NACL 0.5-0.125-0.9 MG/250ML-% EP SOLN
EPIDURAL | Status: AC
  Filled 2021-11-04: qty 250

## 2021-11-04 MED ORDER — LACTATED RINGERS IV SOLN: INTRAVENOUS | Status: DC

## 2021-11-04 MED ORDER — ONDANSETRON HCL 4 MG PO TABS: 4.0000 mg | ORAL_TABLET | ORAL | Status: DC | PRN

## 2021-11-04 MED ORDER — DIPHENHYDRAMINE HCL 50 MG/ML IJ SOLN: 12.5000 mg | INTRAMUSCULAR | Status: DC | PRN

## 2021-11-04 MED ORDER — SIMETHICONE 80 MG PO CHEW: 80.0000 mg | CHEWABLE_TABLET | ORAL | Status: DC | PRN

## 2021-11-04 MED ORDER — ONDANSETRON HCL 4 MG/2ML IJ SOLN: 4.0000 mg | INTRAMUSCULAR | Status: DC | PRN

## 2021-11-04 MED ORDER — LIDOCAINE HCL (PF) 1 % IJ SOLN
INTRAMUSCULAR | Status: DC | PRN
  Administered 2021-11-04: 5 mL via EPIDURAL
  Administered 2021-11-04: 2 mL via EPIDURAL
  Administered 2021-11-04: 3 mL via EPIDURAL

## 2021-11-04 MED ORDER — EPHEDRINE 5 MG/ML INJ: 10.0000 mg | INTRAVENOUS | Status: DC | PRN

## 2021-11-04 MED ORDER — MEASLES, MUMPS & RUBELLA VAC IJ SOLR: 0.5000 mL | Freq: Once | INTRAMUSCULAR | Status: DC

## 2021-11-04 MED ORDER — PHENYLEPHRINE 40 MCG/ML (10ML) SYRINGE FOR IV PUSH (FOR BLOOD PRESSURE SUPPORT): 80.0000 ug | PREFILLED_SYRINGE | INTRAVENOUS | Status: DC | PRN

## 2021-11-04 MED ORDER — LACTATED RINGERS IV SOLN: 500.0000 mL | Freq: Once | INTRAVENOUS | Status: DC

## 2021-11-04 MED ORDER — SOD CITRATE-CITRIC ACID 500-334 MG/5ML PO SOLN: 30.0000 mL | ORAL | Status: DC | PRN

## 2021-11-04 MED ORDER — DIPHENHYDRAMINE HCL 25 MG PO CAPS: 25.0000 mg | ORAL_CAPSULE | Freq: Four times a day (QID) | ORAL | Status: DC | PRN

## 2021-11-04 MED ORDER — TRANEXAMIC ACID-NACL 1000-0.7 MG/100ML-% IV SOLN
INTRAVENOUS | Status: AC
  Filled 2021-11-04: qty 100

## 2021-11-04 MED ORDER — BENZOCAINE-MENTHOL 20-0.5 % EX AERO
1.0000 "application " | INHALATION_SPRAY | CUTANEOUS | Status: DC | PRN
  Filled 2021-11-04: qty 56

## 2021-11-04 MED ORDER — TRANEXAMIC ACID-NACL 1000-0.7 MG/100ML-% IV SOLN
1000.0000 mg | INTRAVENOUS | Status: AC
  Administered 2021-11-04: 21:00:00 1000 mg via INTRAVENOUS

## 2021-11-04 MED ORDER — FLEET ENEMA 7-19 GM/118ML RE ENEM: 1.0000 | ENEMA | Freq: Every day | RECTAL | Status: DC | PRN

## 2021-11-04 MED ORDER — PANTOPRAZOLE SODIUM 40 MG PO TBEC
40.0000 mg | DELAYED_RELEASE_TABLET | Freq: Every day | ORAL | Status: DC
  Administered 2021-11-05: 09:00:00 40 mg via ORAL
  Filled 2021-11-04: qty 1

## 2021-11-04 MED ORDER — ACETAMINOPHEN 500 MG PO TABS
1000.0000 mg | ORAL_TABLET | Freq: Three times a day (TID) | ORAL | Status: DC
  Administered 2021-11-05 – 2021-11-06 (×5): 1000 mg via ORAL
  Filled 2021-11-04 (×5): qty 2

## 2021-11-04 MED ORDER — OXYCODONE-ACETAMINOPHEN 5-325 MG PO TABS: 1.0000 | ORAL_TABLET | ORAL | Status: DC | PRN

## 2021-11-04 MED ORDER — IBUPROFEN 800 MG PO TABS
800.0000 mg | ORAL_TABLET | Freq: Three times a day (TID) | ORAL | Status: DC
  Administered 2021-11-05 – 2021-11-06 (×5): 800 mg via ORAL
  Filled 2021-11-04 (×5): qty 1

## 2021-11-04 MED ORDER — WITCH HAZEL-GLYCERIN EX PADS: 1.0000 "application " | MEDICATED_PAD | CUTANEOUS | Status: DC | PRN

## 2021-11-04 MED ORDER — OXYTOCIN BOLUS FROM INFUSION
333.0000 mL | Freq: Once | INTRAVENOUS | Status: AC
  Administered 2021-11-04: 20:00:00 333 mL via INTRAVENOUS

## 2021-11-04 MED ORDER — LACTATED RINGERS IV SOLN
500.0000 mL | INTRAVENOUS | Status: DC | PRN
  Administered 2021-11-04: 19:00:00 500 mL via INTRAVENOUS

## 2021-11-04 MED ORDER — FENTANYL-BUPIVACAINE-NACL 0.5-0.125-0.9 MG/250ML-% EP SOLN
12.0000 mL/h | EPIDURAL | Status: DC | PRN
  Administered 2021-11-04: 19:00:00 12 mL/h via EPIDURAL

## 2021-11-04 MED ORDER — OXYTOCIN-SODIUM CHLORIDE 30-0.9 UT/500ML-% IV SOLN
2.5000 [IU]/h | INTRAVENOUS | Status: DC
  Filled 2021-11-04: qty 500

## 2021-11-04 MED ORDER — TETANUS-DIPHTH-ACELL PERTUSSIS 5-2.5-18.5 LF-MCG/0.5 IM SUSY: 0.5000 mL | PREFILLED_SYRINGE | Freq: Once | INTRAMUSCULAR | Status: DC

## 2021-11-04 MED ORDER — DIBUCAINE (PERIANAL) 1 % EX OINT: 1.0000 "application " | TOPICAL_OINTMENT | CUTANEOUS | Status: DC | PRN

## 2021-11-04 MED ORDER — PHENYLEPHRINE 40 MCG/ML (10ML) SYRINGE FOR IV PUSH (FOR BLOOD PRESSURE SUPPORT)
PREFILLED_SYRINGE | INTRAVENOUS | Status: AC
  Filled 2021-11-04: qty 10

## 2021-11-04 MED ORDER — HYDROXYZINE HCL 50 MG PO TABS: 50.0000 mg | ORAL_TABLET | Freq: Four times a day (QID) | ORAL | Status: DC | PRN
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
## 2021-11-04 NOTE — Anesthesia Preprocedure Evaluation (Signed)
Anesthesia Evaluation  Patient identified by MRN, date of birth, ID band Patient awake    Reviewed: Allergy & Precautions, NPO status , Patient's Chart, lab work & pertinent test results  History of Anesthesia Complications Negative for: history of anesthetic complications  Airway Mallampati: II  TM Distance: >3 FB Neck ROM: Full    Dental  (+) Teeth Intact, Dental Advisory Given   Pulmonary neg pulmonary ROS,    Pulmonary exam normal breath sounds clear to auscultation       Cardiovascular negative cardio ROS Normal cardiovascular exam Rhythm:Regular Rate:Normal     Neuro/Psych PSYCHIATRIC DISORDERS Anxiety negative neurological ROS     GI/Hepatic Neg liver ROS, GERD  ,  Endo/Other  negative endocrine ROS  Renal/GU negative Renal ROS     Musculoskeletal negative musculoskeletal ROS (+)   Abdominal   Peds  Hematology  (+) Blood dyscrasia, anemia , Plt 336k   Anesthesia Other Findings Day of surgery medications reviewed with the patient.  Reproductive/Obstetrics (+) Pregnancy                             Anesthesia Physical Anesthesia Plan  ASA: 2  Anesthesia Plan: Epidural   Post-op Pain Management:    Induction:   PONV Risk Score and Plan: 2 and Treatment may vary due to age or medical condition  Airway Management Planned: Natural Airway  Additional Equipment:   Intra-op Plan:   Post-operative Plan:   Informed Consent: I have reviewed the patients History and Physical, chart, labs and discussed the procedure including the risks, benefits and alternatives for the proposed anesthesia with the patient or authorized representative who has indicated his/her understanding and acceptance.     Dental advisory given  Plan Discussed with:   Anesthesia Plan Comments: (Patient identified. Risks/Benefits/Options discussed with patient including but not limited to bleeding,  infection, nerve damage, paralysis, failed block, incomplete pain control, headache, blood pressure changes, nausea, vomiting, reactions to medication both or allergic, itching and postpartum back pain. Confirmed with bedside nurse the patient's most recent platelet count. Confirmed with patient that they are not currently taking any anticoagulation, have any bleeding history or any family history of bleeding disorders. Patient expressed understanding and wished to proceed. All questions were answered. )        Anesthesia Quick Evaluation

## 2021-11-04 NOTE — Anesthesia Procedure Notes (Signed)
Epidural Patient location during procedure: OB Start time: 11/04/2021 7:13 PM End time: 11/04/2021 7:20 PM  Staffing Anesthesiologist: Cecile Hearing, MD Performed: anesthesiologist   Preanesthetic Checklist Completed: patient identified, IV checked, risks and benefits discussed, monitors and equipment checked, pre-op evaluation and timeout performed  Epidural Patient position: sitting Prep: DuraPrep Patient monitoring: blood pressure and continuous pulse ox Approach: midline Location: L3-L4 Injection technique: LOR air  Needle:  Needle type: Tuohy  Needle gauge: 17 G Needle length: 9 cm Needle insertion depth: 5 cm Catheter size: 19 Gauge Catheter at skin depth: 10 cm Test dose: negative and Other (1% Lidocaine)  Additional Notes Patient identified.  Risk benefits discussed including failed block, incomplete pain control, headache, nerve damage, paralysis, blood pressure changes, nausea, vomiting, reactions to medication both toxic or allergic, and postpartum back pain.  Patient expressed understanding and wished to proceed.  All questions were answered.  Sterile technique used throughout procedure and epidural site dressed with sterile barrier dressing. No paresthesia or other complications noted. The patient did not experience any signs of intravascular injection such as tinnitus or metallic taste in mouth nor signs of intrathecal spread such as rapid motor block. Please see nursing notes for vital signs. Reason for block:procedure for pain

## 2021-11-04 NOTE — Discharge Summary (Signed)
Postpartum Discharge Summary      Patient Name: Michelle Bird DOB: [birth year] MRN: [id number]  Date of admission: 11/04/2021 Delivery date:11/04/2021  Delivering provider: Katherine Basset [facility]  Date of discharge: 11/06/2021  Admitting diagnosis: Normal labor [O80, Z37.9] Intrauterine pregnancy: [redacted]w[redacted]d    Secondary diagnosis:  Principal Problem:   Normal labor Active Problems:   Adjustment disorder with mixed anxiety and depressed mood   Encounter for supervision of normal first pregnancy in third trimester   Chlamydia infection affecting pregnancy in first trimester   COVID-19 affecting pregnancy in second trimester  Additional problems: None    Discharge diagnosis: Term Pregnancy Delivered                                              Post partum procedures: None Augmentation: AROM Complications: None  Hospital course: Onset of Labor With Vaginal Delivery      25y.o. yo G1P0 at 359w4das admitted in Active Labor on 11/04/2021. Patient had an uncomplicated labor course as follows:  Membrane Rupture Time/Date: 8:03 PM ,11/04/2021   Delivery Method:Vaginal, Spontaneous  Episiotomy: None  Lacerations:  2nd degree;Vaginal  Patient had an uncomplicated postpartum course.  She is ambulating, tolerating a regular diet, passing flatus, and urinating well. Patient is discharged home in stable condition on 11/06/21.  Newborn Data: Birth date:11/04/2021  Birth time:8:24 PM  Gender:Female  Living status:Living  Apgars:9 ,9  Weight:3260 g   Magnesium Sulfate received: No BMZ received: No Rhophylac:N/A MMR:N/A T-DaP:Given prenatally Flu: Yes - prenatally Transfusion:No  Physical exam  Vitals:   11/05/21 1127 11/05/21 1438 11/05/21 2113 11/06/21 0644  BP: 124/84 126/79 115/86 118/77  Pulse: 97 92 (!) 102 78  Resp: _0 Temp: 98.1 F (36.7 C) 98.2 F (36.8 C) 98.4 F (36.9 C) 98.2 F (36.8 C)  TempSrc: Oral Oral Oral Axillary  SpO2: 98% 99% 99%  100%  Weight:       General: alert Lochia: appropriate Uterine Fundus: firm Incision: N/A DVT Evaluation: No evidence of DVT seen on physical exam. Labs: Lab Results  Component Value Date   WBC 11.6 (H) 11/04/2021   HGB 10.0 (L) 11/04/2021   HCT 32.7 (L) 11/04/2021   MCV 74.8 (L) 11/04/2021   PLT 336 11/04/2021   CMP Latest Ref Rng & Units 03/18/2021  Glucose 70 - 99 mg/dL 128(H)  BUN 6 - 20 mg/dL 8  Creatinine 0.44 - 1.00 mg/dL 0.86  Sodium 135 - 145 mmol/L 134(L)  Potassium 3.5 - 5.1 mmol/L 4.1  Chloride 98 - 111 mmol/L 103  CO2 22 - 32 mmol/L 21(L)  Calcium 8.9 - 10.3 mg/dL 9.2  Total Protein 6.5 - 8.1 g/dL -  Total Bilirubin 0.3 - 1.2 mg/dL -  Alkaline Phos 47 - 119 U/L -  AST 15 - 41 U/L -  ALT 14 - 54 U/L -   Edinburgh Score: Edinburgh Postnatal Depression Scale Screening Tool 11/05/2021  I have been able to laugh and see the funny side of things. 0  I have looked forward with enjoyment to things. 0  I have blamed myself unnecessarily when things went wrong. 2  I have been anxious or worried for no good reason. 2  I have felt scared or panicky for no good reason. 1  Things have been getting on top of  me. 1  I have been so unhappy that I have had difficulty sleeping. 1  I have felt sad or miserable. 1  I have been so unhappy that I have been crying. 1  The thought of harming myself has occurred to me. 0  Edinburgh Postnatal Depression Scale Total 9     After visit meds:  Allergies as of 11/06/2021       Reactions   Amoxicillin    Other reaction(s): vomiting   Other    Lobster- allergy test confirmed. Pt can still eat shrimp and crab.        Medication List     STOP taking these medications    Blood Pressure Monitor Devi   butalbital-acetaminophen-caffeine 50-325-40 MG tablet Commonly known as: FIORICET   ferrous sulfate 325 (65 FE) MG tablet Commonly known as: FerrouSul   fluconazole 150 MG tablet Commonly known as: DIFLUCAN    ketoconazole 2 % cream Commonly known as: NIZORAL   nystatin cream Commonly known as: MYCOSTATIN   pantoprazole 20 MG tablet Commonly known as: PROTONIX   sodium chloride 0.65 % Soln nasal spray Commonly known as: OCEAN   triamcinolone ointment 0.5 % Commonly known as: KENALOG       TAKE these medications    acetaminophen 500 MG tablet Commonly known as: TYLENOL Take 2 tablets (1,000 mg total) by mouth every 8 (eight) hours. What changed:  how much to take when to take this   ibuprofen 800 MG tablet Commonly known as: ADVIL Take 1 tablet (800 mg total) by mouth every 8 (eight) hours.   prenatal vitamin w/FE, FA 29-1 MG Chew chewable tablet Chew 1 tablet by mouth daily at 12 noon.         Discharge home in stable condition Infant Feeding: Bottle and Breast Infant Disposition:home with mother Discharge instruction: per After Visit Summary and Postpartum booklet. Activity: Advance as tolerated. Pelvic rest for 6 weeks.  Diet: routine diet Future Appointments: Future Appointments  Date Time Provider Doon  11/18/2021  9:00 AM Lynnea Ferrier, LCSW [facility] None  12/15/2021 10:55 AM Leftwich-Kirby, Kathie Dike, CNM [facility] None   Follow up Visit:   Please schedule this patient for a In person postpartum visit in 6 weeks with the following provider: Any provider. Additional Postpartum F/U:Postpartum Depression checkup  Low risk pregnancy complicated by:  N/A Delivery mode:  Vaginal, Spontaneous  Anticipated Birth Control:  Unsure- would like to discuss at [facility] visit  Renard Matter, MD, MPH OB Fellow, Faculty Practice

## 2021-11-04 NOTE — MAU Note (Signed)
Pt reports contractions for the last 3 hours, denies bleeding or ROM. Reports positive fetal movement.

## 2021-11-05 ENCOUNTER — Encounter: Payer: Medicaid Other | Admitting: Medical

## 2021-11-05 LAB — RPR: RPR Ser Ql: NONREACTIVE

## 2021-11-05 NOTE — Social Work (Signed)
CSW attempted to meet with MOB, however MOB resting. CSW will follow-up prior to discharge.  Manfred Arch, MSW, [employer] Clinical Social Work [employer] and [employer] [phone]

## 2021-11-05 NOTE — Lactation Note (Signed)
This note was copied from a baby's chart. Lactation Consultation Note  Patient Name: Michelle Bird HUTML'Y Date: 11/05/2021   Age:23 hours  Mom is a P1 who reports + breast changes w/pregnancy. Mom's L breast is larger than her R breast, which Mom reports was that way prior to pregnancy.  Infant latched with ease using the teacup hold. I taught Mom how to identify the sound of swallows. Now that Mom knows the sound, Mom reports hearing a lot of swallows last night.  Mom was made aware of O/P services, breastfeeding support groups, and our phone # for post-discharge questions.   Mom knows she can call for me to return, if desired.    [name] [facility] 11/05/2021, 9:44 AM

## 2021-11-05 NOTE — Lactation Note (Signed)
This note was copied from a baby's chart. Lactation Consultation Note  Patient Name: Michelle Bird OFBPZ'W Date: 11/05/2021 Reason for consult: Follow-up assessment;Mother's request Age:23 hours Mom requested assistance with using DEBP, mom's current feeding plan is to "pump only", and formula fed infant.  Per mom, infant is currently receiving 15 mls of formula per feeding. Mom knows to feed infant according to feeding cues, 8 to 12 times within 24 hours. Mom plans to pump every 3 hours for 15 minutes on initial setting and offer infant any EBM first before formula. Mom knows on Day 2 infant may consume 15 to 30 mls of EBM/and or formula per feeding. Mom shown how to use DEBP & how to disassemble, clean, & reassemble parts.  Maternal Data    Feeding Mother's Current Feeding Choice: Breast Milk and Formula Nipple Type: Slow - flow  LATCH Score                    Lactation Tools Discussed/Used Tools: Pump Breast pump type: Double-Electric Breast Pump Pump Education: Setup, frequency, and cleaning;Milk Storage Reason for Pumping: Mom desires to " pump only " and formula feed infant. Pumping frequency: Mom will pump every 3 hours for 15 minutes on inital setting.  Interventions Interventions: Skin to skin;Education;DEBP  Discharge Pump: DEBP  Consult Status Consult Status: Follow-up Date: 11/06/21 Follow-up type: In-patient    Danelle Earthly 11/05/2021, 6:49 PM

## 2021-11-05 NOTE — Social Work (Signed)
CSW received consult for hx of Anxiety and Depression.  CSW met with MOB to offer support and complete assessment.   ° °CSW met with MOB at bedside and introduced CSW. CSW observed MOB holding the infant, FOB and paternal grandfather at bedside. CSW offered MOB privacy. MOB presented calm and welcomed her family to stay during the assessment. CSW inquired how MOB has felt since giving birth. MOB reported feeling tired but fine. MOB shared the L&D went very fast with no effects from the epidural that was given to help with pain. CSW inquired how MOB felt emotionally during the pregnancy. MOB reported she felt fine with no concerns. CSW inquired about MOB history of depression and anxiety. MOB reported she experienced depression and anxiety symptoms in middle and high school. MOB reported she was diagnosed with both depression and anxiety in 2012-2013. MOB reported [facility] [employer] [facility] [facility] because "I did not word how I felt correctly to staff, and they thought I was going to hurt myself." MOB reported while she was hospitalized, she able to get to the root of her issues and talk through them. MOB reported no history of psychotropic medications. CSW inquired about MOB coping strategies. MOB reported "it depends on the situation; I have to be dramatic for ten seconds then I am fine. I also process and evaluate the situation to come up with a solution." MOB reported she also listens to music. CSW encouraged MOB to continue implementing coping strategies. MOB was knowledgeable about PPD and receptive to resources. CSW provided education regarding the baby blues period vs. perinatal mood disorders, discussed treatment and gave resources for mental health follow.  CSW recommended MOB complete a self-evaluation during the postpartum time period using the New Mom Checklist from Postpartum Progress and encouraged MOB to contact a medical professional if symptoms are noted at any  time. MOB reported she feels comfortable reaching out to her doctor if she has concerns. MOB denied SI/HI. MOB identified her dad, FOB and FOB family as supports.  ° °CSW provided review of Sudden Infant Death Syndrome (SIDS) precautions. MOB reported she has essential items for the infant including a crib where the infant will sleep. MOB has chosen [facility] for the infant's follow up care and will have transportation to appointments. CSW assessed MOB for additional needs. MOB reported no further need.  ° °CSW identifies no further need for intervention and no barriers to discharge at this time.  ° °Michelle Bird, MSW, LCSW °[facility]  °Clinical Social Worker  °[phone] °11/05/2021  3:44 PM   °

## 2021-11-05 NOTE — Anesthesia Postprocedure Evaluation (Signed)
Anesthesia Post Note  Patient: Michelle Bird  Procedure(s) Performed: AN AD HOC LABOR EPIDURAL     Patient location during evaluation: Mother Baby Anesthesia Type: Epidural Level of consciousness: awake, oriented and awake and alert Pain management: pain level controlled Vital Signs Assessment: post-procedure vital signs reviewed and stable Respiratory status: spontaneous breathing, respiratory function stable and nonlabored ventilation Cardiovascular status: stable Postop Assessment: no headache, adequate PO intake, able to ambulate, patient able to bend at knees, no backache and no apparent nausea or vomiting Anesthetic complications: no   No notable events documented.  Last Vitals:  Vitals:   11/05/21 0400 11/05/21 0733  BP: 119/72 122/78  Pulse: 86 100  Resp: 18 15  Temp: 36.5 C 36.8 C  SpO2: 100% 99%    Last Pain:  Vitals:   11/05/21 0733  TempSrc: Oral  PainSc: 6    Pain Goal: Patients Stated Pain Goal: 0 (11/04/21 1910)                 Raquan Iannone

## 2021-11-05 NOTE — Lactation Note (Signed)
This note was copied from a baby's chart. Lactation Consultation Note  Patient Name: Michelle Bird MOQHU'T Date: 11/05/2021   Age:23 hours   LC visit attempted, but RN in room. LC to return at a later time.   [name] [facility] 11/05/2021, 7:59 AM

## 2021-11-06 MED ORDER — ACETAMINOPHEN 500 MG PO TABS: 500.0000 mg | ORAL_TABLET | Freq: Four times a day (QID) | ORAL | 0 refills | Status: DC | PRN

## 2021-11-06 MED ORDER — ACETAMINOPHEN 500 MG PO TABS: 1000.0000 mg | ORAL_TABLET | Freq: Three times a day (TID) | ORAL | 0 refills | Status: DC

## 2021-11-06 MED ORDER — IBUPROFEN 800 MG PO TABS
800.0000 mg | ORAL_TABLET | Freq: Three times a day (TID) | ORAL | 0 refills | Status: DC
Start: 2021-11-06 — End: 2022-01-26

## 2021-11-06 NOTE — Progress Notes (Signed)
Interim Progress Note  Called by RN due to patient reported right ear feeling "under water". Evaluated at bedside. Patient reports it started around lunch time today and now has been a little sore. Not bothering her much right now, only hurts "if I mess with it". Denies any history of otitis media/externa. Denies any ear drainage or fever. Uses q-tips at home, but has not recently placed anything in her ear.   Blood pressure 115/86, pulse (!) 102, temperature 98.4 F (36.9 C), temperature source Oral, resp. rate 16, weight 92.3 kg, last menstrual period 01/31/2021, SpO2 99 %, unknown if currently breastfeeding.   Gen: Alert, NAD  ENT: Left ear external canal clear and TM intact/clear/non-erythematous with light reflex. Right ear external canal with some cerumen and slight erythema next to this but otherwise without notable edema or erythema. Right TM clear, intact, and with appropriate light reflex. No mastoid tenderness or swelling on the R. Pain with pulling of R pinna.   A/p:  Clinically symptoms consistent with possible R otitis externa, however objectively without significant canal changes. Not currently bothering patient. Discussed trial of antibiotic ear drops vs observation overnight; she would like monitor and see if symptoms persist/worsen.   If not improving or worsening--plan to start Ciprodex. She is aware to let us know how it is doing in the morning or sooner if worsening.   Allayne Stack, DO

## 2021-11-06 NOTE — Lactation Note (Signed)
This note was copied from a baby's chart. Lactation Consultation Note  Patient Name: Michelle Bird Date: 11/06/2021 Reason for consult: Follow-up assessment Age:23 hours  Mother states she is currently only formula feeding. Discussed pumping q 3 hours and cabbage leaves is she chooses to dry up her milk supply.   Feeding Mother's Current Feeding Choice: Formula  Interventions Interventions: Education  Discharge Discharge Education: Engorgement and breast care;Warning signs for feeding baby  Consult Status Consult Status: Complete    Hardie Pulley 11/06/2021, 11:37 AM

## 2021-11-11 ENCOUNTER — Encounter: Payer: Self-pay | Admitting: Advanced Practice Midwife

## 2021-11-17 ENCOUNTER — Telehealth (HOSPITAL_COMMUNITY): Payer: Self-pay | Admitting: *Deleted

## 2021-11-17 NOTE — Telephone Encounter (Signed)
Attempted hospital discharge follow-up call. Left message for patient to return RN call. Deforest Hoyles, RN, 11/17/21, [phone]

## 2021-11-18 ENCOUNTER — Encounter: Payer: Medicaid Other | Admitting: Licensed Clinical Social Worker

## 2021-11-18 ENCOUNTER — Other Ambulatory Visit: Payer: Self-pay

## 2021-11-26 ENCOUNTER — Other Ambulatory Visit: Payer: Self-pay | Admitting: Family Medicine

## 2021-12-04 ENCOUNTER — Other Ambulatory Visit: Payer: Self-pay | Admitting: Family Medicine

## 2021-12-15 ENCOUNTER — Ambulatory Visit (INDEPENDENT_AMBULATORY_CARE_PROVIDER_SITE_OTHER): Payer: Medicaid Other | Admitting: Advanced Practice Midwife

## 2021-12-15 ENCOUNTER — Encounter: Payer: Self-pay | Admitting: Advanced Practice Midwife

## 2021-12-15 ENCOUNTER — Other Ambulatory Visit: Payer: Self-pay

## 2021-12-15 VITALS — BP 113/70 | HR 98 | Ht 67.0 in | Wt 181.2 lb

## 2021-12-15 DIAGNOSIS — Z30013 Encounter for initial prescription of injectable contraceptive: Secondary | ICD-10-CM

## 2021-12-15 DIAGNOSIS — Z3009 Encounter for other general counseling and advice on contraception: Secondary | ICD-10-CM

## 2021-12-15 DIAGNOSIS — K625 Hemorrhage of anus and rectum: Secondary | ICD-10-CM | POA: Diagnosis not present

## 2021-12-15 MED ORDER — MEDROXYPROGESTERONE ACETATE 150 MG/ML IM SUSP
150.0000 mg | Freq: Once | INTRAMUSCULAR | Status: AC
  Administered 2021-12-15: 150 mg via INTRAMUSCULAR

## 2021-12-15 MED ORDER — MEDROXYPROGESTERONE ACETATE 150 MG/ML IM SUSP: 150.0000 mg | INTRAMUSCULAR | 4 refills | Status: DC

## 2021-12-15 NOTE — Progress Notes (Signed)
Post Partum Visit Note  Michelle Bird is a 26 y.o. G45P1001 female who presents for a postpartum visit. She is 5 weeks postpartum following a normal spontaneous vaginal delivery.  I have fully reviewed the prenatal and intrapartum course. The delivery was at 39 gestational weeks.  Anesthesia: epidural. Postpartum course has been uncomplicated. Baby is doing well. Baby is feeding by bottle - Enfamil Neuropro Gentle Ease . Bleeding no bleeding. Bowel function is normal. Bladder function is normal. Patient is not sexually active. Contraception method is none. Postpartum depression screening: negative.   The pregnancy intention screening data noted above was reviewed. Potential methods of contraception were discussed. The patient elected to proceed with No data recorded.   Edinburgh Postnatal Depression Scale - 12/15/21 1122       Edinburgh Postnatal Depression Scale:  In the Past 7 Days   I have been able to laugh and see the funny side of things. 0    I have looked forward with enjoyment to things. 0    I have blamed myself unnecessarily when things went wrong. 0    I have been anxious or worried for no good reason. 0    I have felt scared or panicky for no good reason. 0    Things have been getting on top of me. 0    I have been so unhappy that I have had difficulty sleeping. 0    I have felt sad or miserable. 0    I have been so unhappy that I have been crying. 0    The thought of harming myself has occurred to me. 0    Edinburgh Postnatal Depression Scale Total 0             Health Maintenance Due  Topic Date Due   COVID-19 Vaccine (1) Never done   HPV VACCINES (1 - 2-dose series) Never done   INFLUENZA VACCINE  Never done    The following portions of the patient's history were reviewed and updated as appropriate: allergies, current medications, past family history, past medical history, past social history, past surgical history, and problem list.  Review of  Systems Pertinent items noted in HPI and remainder of comprehensive ROS otherwise negative.  Objective:  BP 113/70    Pulse 98    Ht 5\' 7"  (1.702 m)    Wt 181 lb 3.2 oz (82.2 kg)    LMP 01/31/2021 Comment: shielded    Breastfeeding No    BMI 28.38 kg/m   VS reviewed, nursing note reviewed,  Constitutional: well developed, well nourished, no distress HEENT: normocephalic CV: normal rate Pulm/chest wall: normal effort Abdomen: soft Neuro: alert and oriented x 3 Skin: warm, dry Psych: affect normal   Pelvic: visual inspection of perineum at pt request, no visible or palpable sutures, some minimal scar tissue felt at perineum. No bleeding on exam.   Assessment:   1. Encounter for initial prescription of injectable contraceptive  - medroxyPROGESTERone (DEPO-PROVERA) injection 150 mg  2. Rectum bleeding --pt reports bleeding with some bowel movements. It is light, only with wiping.  Pt concerned about her stitches.  --No bleeding on exam, perineal sutures well healed --Pt denies constipation.  Pt to continue to eat high fiber/increased fluids. --FU in our office in 3 months so will refer if bleeding persists --Reviewed warning signs/reasons to seek care sooner  3. Postpartum exam --Doing well, bonding well with baby, good support at home  4. Encounter for counseling regarding contraception --Discussed  pt contraceptive plans and reviewed contraceptive methods based on pt preferences and effectiveness.  Pt prefers Depo injection today. --Depo given in office today --F/U with provider in 3 months, continue Depo if desired   Plan:   Essential components of care per ACOG recommendations:  1.  Mood and well being: Patient with negative depression screening today. Reviewed local resources for support.  - Patient tobacco use? No.   - hx of drug use? No.    2. Infant care and feeding:  -Patient currently breastmilk feeding? No.  -Social determinants of health (SDOH) reviewed in  EPIC. No concerns    3. Sexuality, contraception and birth spacing - Patient does not want a pregnancy in the next year.  - See contraception above. Depo given today. - Discussed birth spacing of 18 months  4. Sleep and fatigue -Encouraged family/partner/community support of 4 hrs of uninterrupted sleep to help with mood and fatigue  5. Physical Recovery  - Discussed patients delivery and complications. She describes her labor as good. - Patient had a Vaginal, no problems at delivery. Patient had a 2nd degree laceration. Perineal healing reviewed. Patient expressed understanding - Patient has urinary incontinence? No. - Patient is safe to resume physical and sexual activity  6.  Health Maintenance - HM due items addressed Yes - Last pap smear  Diagnosis  Date Value Ref Range Status  05/26/2021   Final   - Negative for intraepithelial lesion or malignancy (NILM)   Pap smear not done at today's visit.  -Breast Cancer screening indicated? No.   7. Chronic Disease/Pregnancy Condition follow up: None  - PCP follow up  Sharen Counter, CNM [facility] [employer], [facility]

## 2021-12-15 NOTE — Progress Notes (Signed)
Patient presents for PP visit. Patient desires pill for contraception.

## 2021-12-16 ENCOUNTER — Encounter: Payer: Self-pay | Admitting: Advanced Practice Midwife

## 2021-12-17 ENCOUNTER — Encounter: Payer: Self-pay | Admitting: *Deleted

## 2022-01-26 ENCOUNTER — Encounter (HOSPITAL_BASED_OUTPATIENT_CLINIC_OR_DEPARTMENT_OTHER): Payer: Self-pay | Admitting: Emergency Medicine

## 2022-01-26 ENCOUNTER — Other Ambulatory Visit (HOSPITAL_BASED_OUTPATIENT_CLINIC_OR_DEPARTMENT_OTHER): Payer: Self-pay

## 2022-01-26 ENCOUNTER — Emergency Department (HOSPITAL_BASED_OUTPATIENT_CLINIC_OR_DEPARTMENT_OTHER)
Admission: EM | Admit: 2022-01-26 | Discharge: 2022-01-26 | Disposition: A | Discharge status: Home / Self Care | Payer: Medicaid Other | Attending: Emergency Medicine | Admitting: Emergency Medicine

## 2022-01-26 ENCOUNTER — Other Ambulatory Visit: Payer: Self-pay

## 2022-01-26 DIAGNOSIS — J069 Acute upper respiratory infection, unspecified: Secondary | ICD-10-CM | POA: Insufficient documentation

## 2022-01-26 DIAGNOSIS — Z20822 Contact with and (suspected) exposure to covid-19: Secondary | ICD-10-CM | POA: Insufficient documentation

## 2022-01-26 DIAGNOSIS — R059 Cough, unspecified: Secondary | ICD-10-CM | POA: Diagnosis present

## 2022-01-26 DIAGNOSIS — J029 Acute pharyngitis, unspecified: Secondary | ICD-10-CM | POA: Insufficient documentation

## 2022-01-26 LAB — PREGNANCY, URINE: Preg Test, Ur: NEGATIVE

## 2022-01-26 LAB — GROUP A STREP BY PCR: Group A Strep by PCR: NOT DETECTED

## 2022-01-26 LAB — RESP PANEL BY RT-PCR (FLU A&B, COVID) ARPGX2
Influenza A by PCR: NEGATIVE
Influenza B by PCR: NEGATIVE
SARS Coronavirus 2 by RT PCR: NEGATIVE

## 2022-01-26 MED ORDER — GUAIFENESIN 100 MG/5ML PO LIQD
5.0000 mL | ORAL | 0 refills | Status: DC | PRN
  Filled 2022-01-26: qty 118, 4d supply, fill #0

## 2022-01-26 MED ORDER — MENTHOL 3 MG MT LOZG
1.0000 | LOZENGE | OROMUCOSAL | 0 refills | Status: DC | PRN
  Filled 2022-01-26: qty 27, fill #0

## 2022-01-26 MED ORDER — GUAIFENESIN-CODEINE 100-10 MG/5ML PO SOLN
5.0000 mL | Freq: Once | ORAL | Status: AC
  Administered 2022-01-26: 5 mL via ORAL
  Filled 2022-01-26: qty 5

## 2022-01-26 MED ORDER — LIDOCAINE VISCOUS HCL 2 % MT SOLN
15.0000 mL | Freq: Once | OROMUCOSAL | Status: AC
  Administered 2022-01-26: 15 mL via OROMUCOSAL
  Filled 2022-01-26: qty 15

## 2022-01-26 NOTE — Discharge Instructions (Addendum)
It was a pleasure caring for you today in the emergency department. ? ?Consider using a humidifier in your bedroom.  ? ?Please return to the emergency department for any worsening or worrisome symptoms. ? ?
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
## 2022-01-26 NOTE — ED Triage Notes (Signed)
Pt arrives to ED with c/o sore throat, cough, congestion, fevers x6 days.  ?

## 2022-02-13 ENCOUNTER — Encounter: Payer: Self-pay | Admitting: Advanced Practice Midwife

## 2022-02-23 ENCOUNTER — Other Ambulatory Visit: Payer: Self-pay | Admitting: Family Medicine

## 2022-03-03 ENCOUNTER — Encounter: Payer: Self-pay | Admitting: Advanced Practice Midwife

## 2022-03-03 ENCOUNTER — Ambulatory Visit (INDEPENDENT_AMBULATORY_CARE_PROVIDER_SITE_OTHER): Payer: Medicaid Other

## 2022-03-03 ENCOUNTER — Ambulatory Visit (INDEPENDENT_AMBULATORY_CARE_PROVIDER_SITE_OTHER): Payer: Medicaid Other | Admitting: Advanced Practice Midwife

## 2022-03-03 ENCOUNTER — Encounter: Payer: Self-pay | Admitting: Obstetrics

## 2022-03-03 VITALS — BP 123/79 | HR 102 | Ht 67.0 in | Wt 192.0 lb

## 2022-03-03 DIAGNOSIS — Z3009 Encounter for other general counseling and advice on contraception: Secondary | ICD-10-CM

## 2022-03-03 DIAGNOSIS — Z3042 Encounter for surveillance of injectable contraceptive: Secondary | ICD-10-CM

## 2022-03-03 MED ORDER — MEDROXYPROGESTERONE ACETATE 150 MG/ML IM SUSP
150.0000 mg | Freq: Once | INTRAMUSCULAR | Status: AC
  Administered 2022-03-03: 150 mg via INTRAMUSCULAR

## 2022-03-03 NOTE — Progress Notes (Deleted)
? ?  GYNECOLOGY PROGRESS NOTE ? ?History:  ?26 y.o. G1P1001 presents to [facility] *** office today for contraceptive follow up visit.She initiated Depo Provera for contraception on 12/15/21 at her postpartum visit following SVD on 11/04/21.   ? ?She reports *****.  She denies h/a, dizziness, shortness of breath, n/v, or fever/chills.   ? ?The following portions of the patient's history were reviewed and updated as appropriate: allergies, current medications, past family history, past medical history, past social history, past surgical history and problem list. Last pap smear on *** was normal, *** HRHPV. ? ?Health Maintenance Due  ?Topic Date Due  ? COVID-19 Vaccine (1) Never done  ? HPV VACCINES (1 - 2-dose series) Never done  ?  ? ?Review of Systems:  ?Pertinent items are noted in HPI. ?  ?Objective:  ?Physical Exam ?not currently breastfeeding. ?VS reviewed, nursing note reviewed,  ?Constitutional: well developed, well nourished, no distress ?HEENT: normocephalic ?CV: normal rate ?Pulm/chest wall: normal effort ?Breast Exam: deferred ?Abdomen: soft ?Neuro: alert and oriented x 3 ?Skin: warm, dry ?Psych: affect normal ?Pelvic exam: Cervix pink, visually closed, without lesion, scant white creamy discharge, vaginal walls and external genitalia normal ?Bimanual exam: Cervix 0/long/high, firm, anterior, neg CMT, uterus nontender, nonenlarged, adnexa without tenderness, enlargement, or mass ? ?Assessment & Plan:  ?There are no diagnoses linked to this encounter. ? ?No follow-ups on file.  ? ?Fatima Blank, CNM ?8:18 AM  ?

## 2022-03-03 NOTE — Progress Notes (Signed)
Patient presents for Depo Injection. Patient is within her window. Inj given in LD . Patient tolerated well. Next depo due 7/11-7/25.  ?

## 2022-03-04 NOTE — Progress Notes (Signed)
Appt cancelled, not seen by provider
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
## 2022-04-17 IMAGING — DX DG TIBIA/FIBULA PORT 2V*R*
1 series · 4 of 4 positions shown · non-contrast
Comparison: Right tibia/fibula radiographs 03/18/2021

CLINICAL DATA: Postop.

EXAM:
PORTABLE RIGHT TIBIA AND FIBULA - 2 VIEW

[Series 1: leg · 0.14mm/px · 4 of 4 slices shown]
[im 1/4]
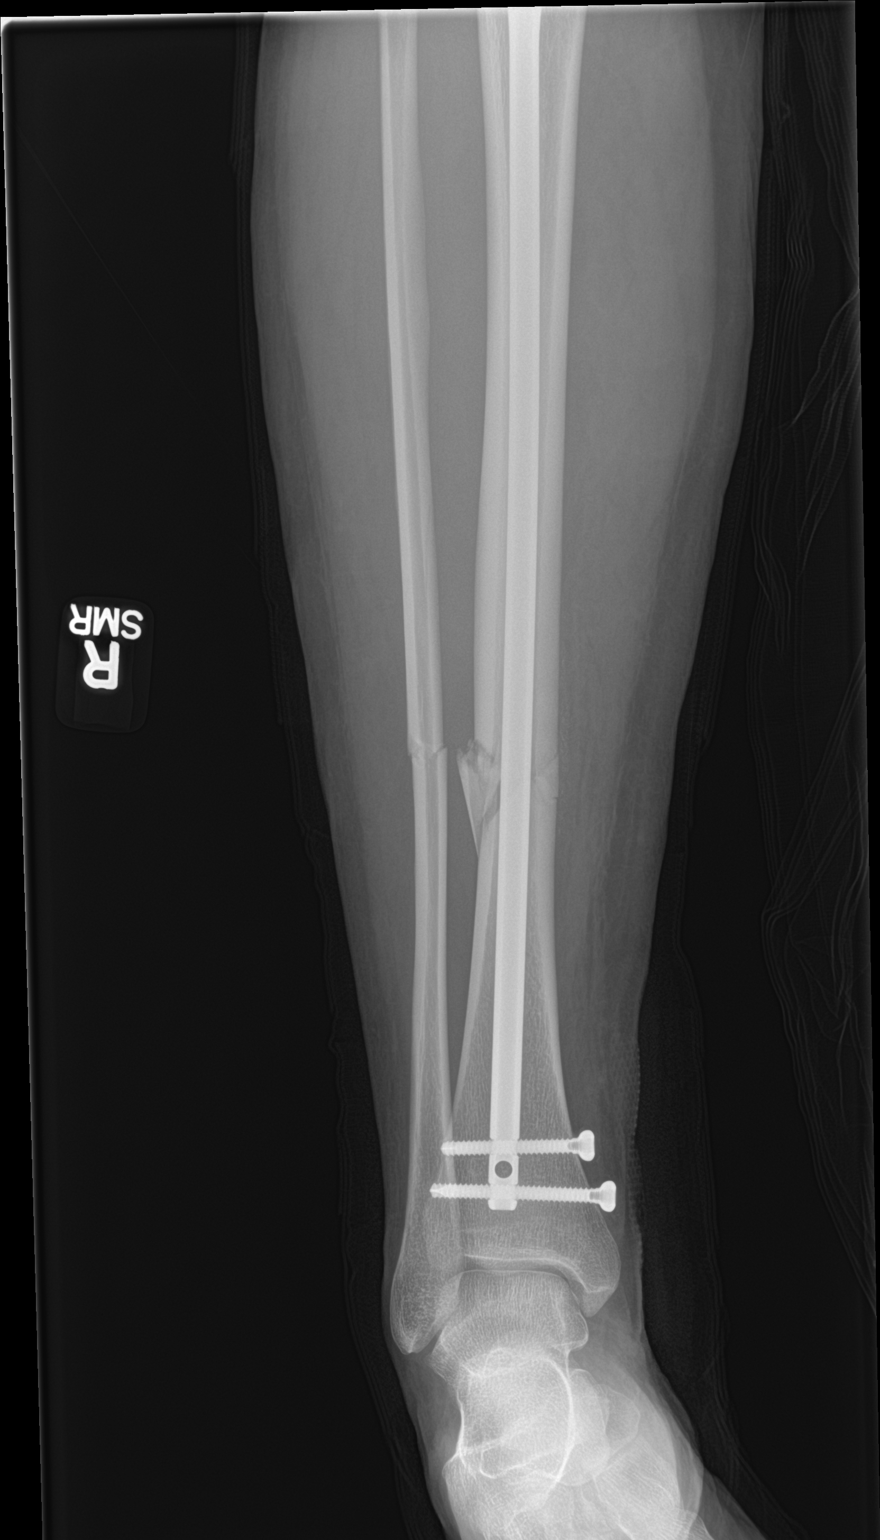
[im 2/4]
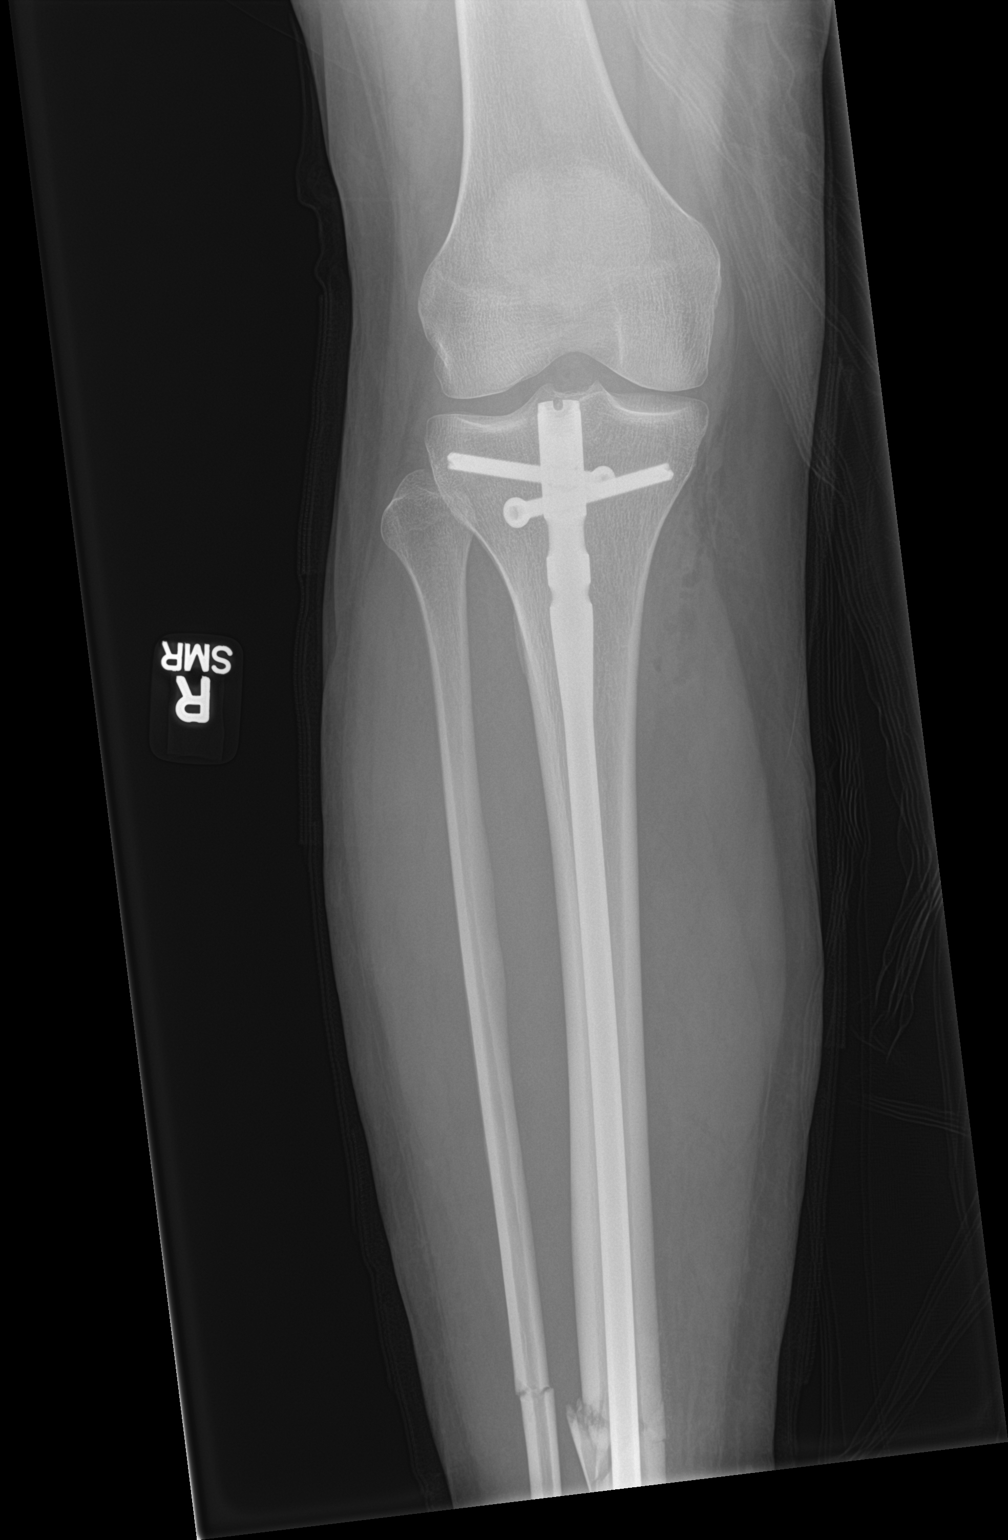
[im 3/4]
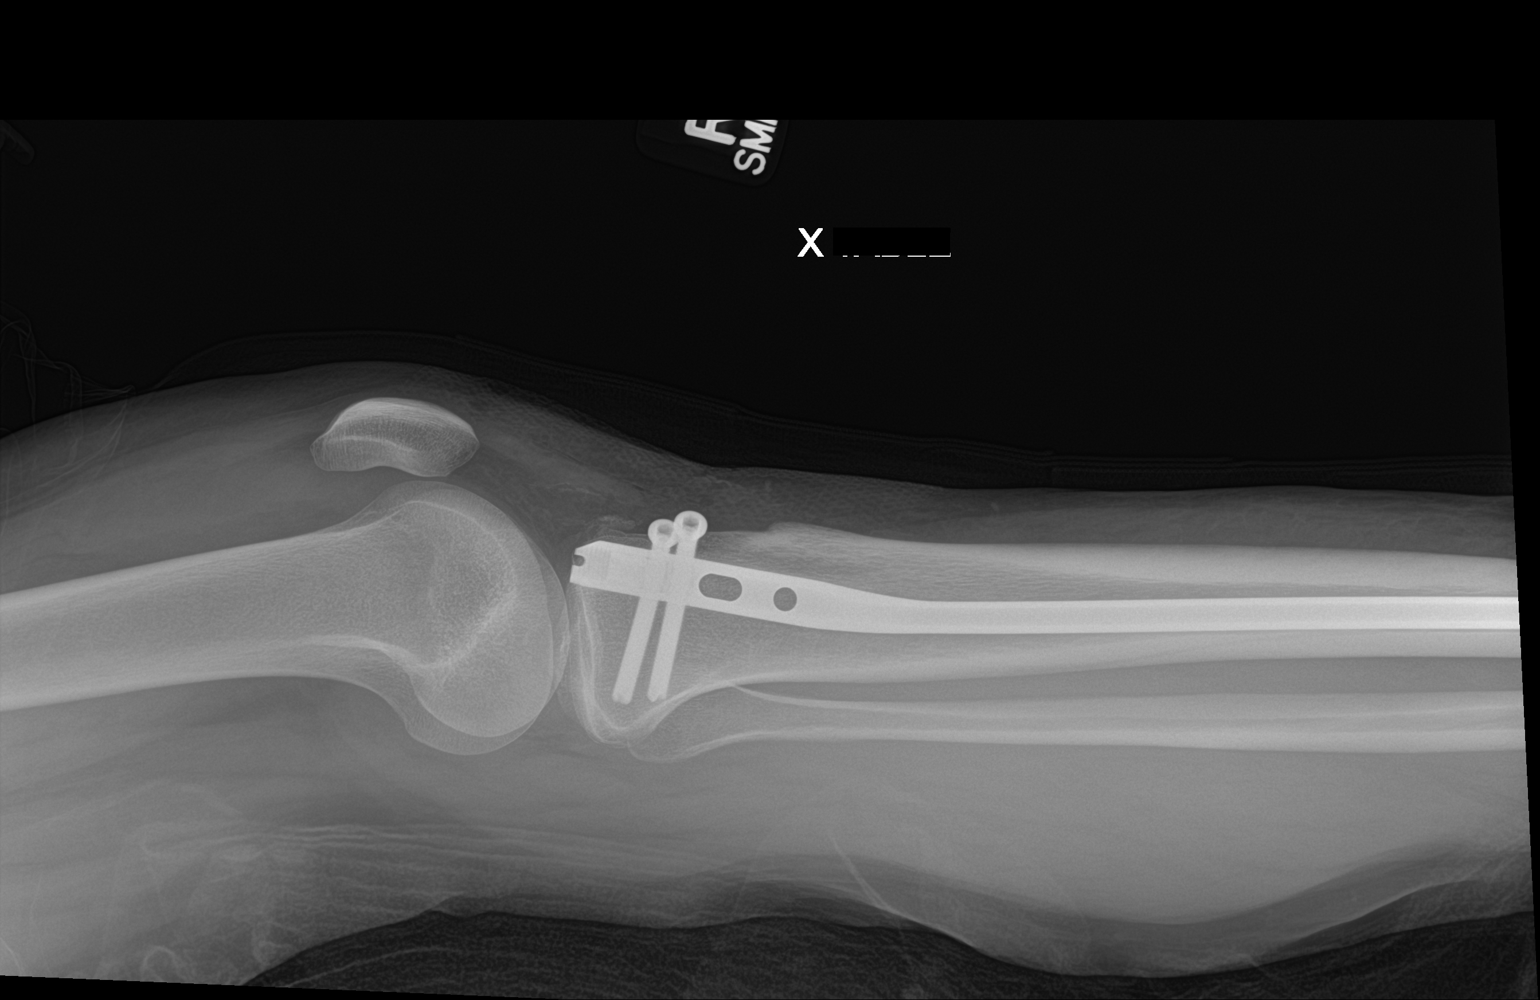
[im 4/4]
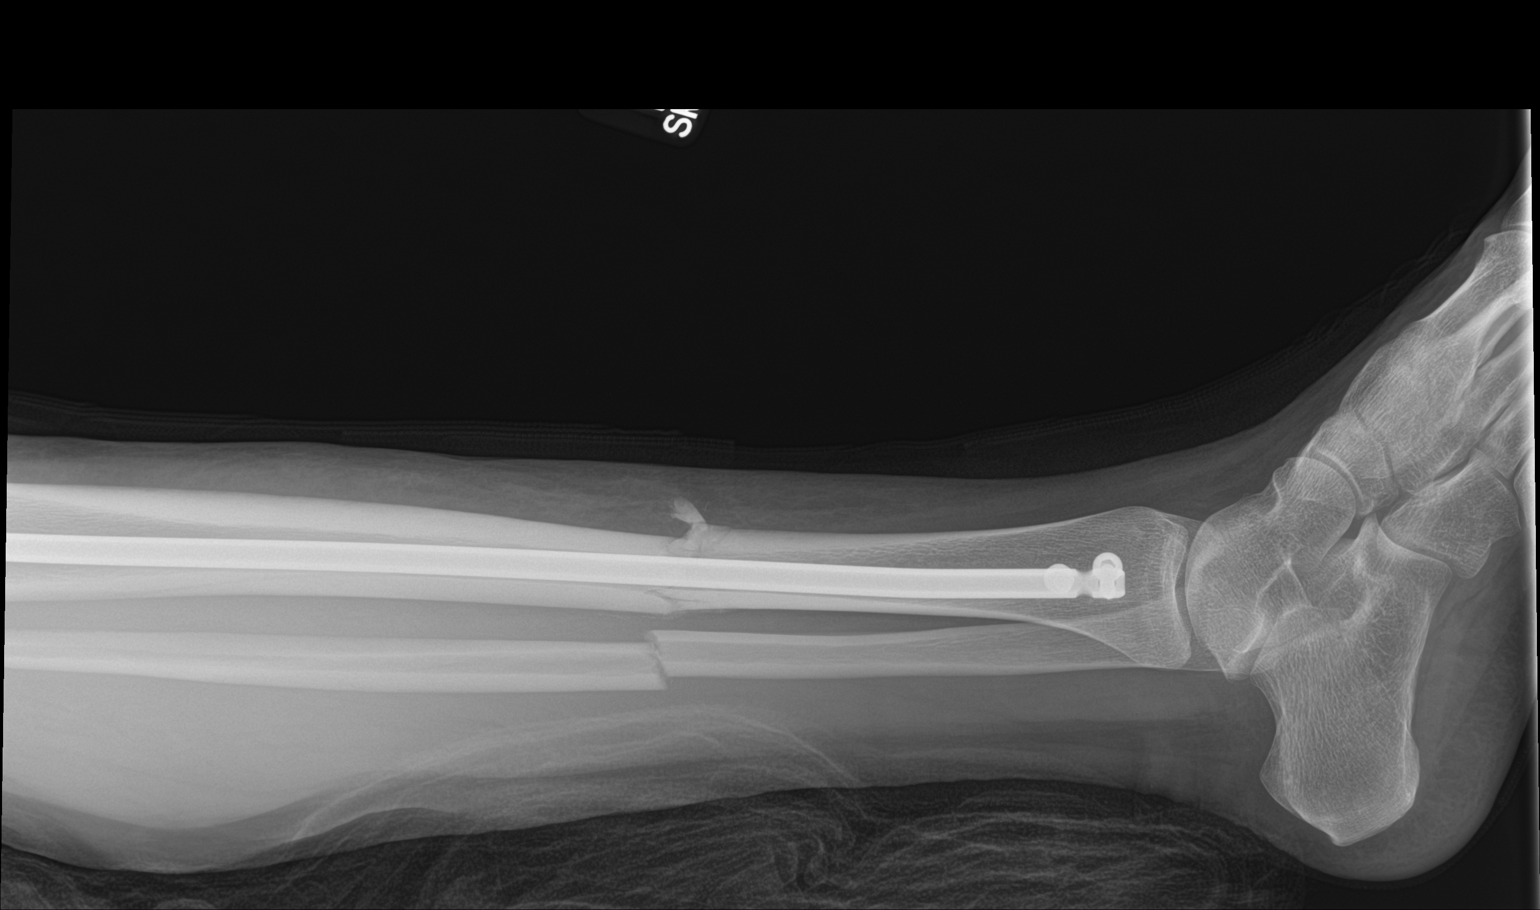

[4 of 4 positions shown; findings below may reference images not displayed]

FINDINGS: Interval intramedullary rod and screw fixation of the tibia with
proximal and distal interlocking screws. Hardware appears intact.
Stable alignment of the distal tibia and fibula fractures. No new
acute osseous finding. Postoperative changes in the regional soft
tissues.
IMPRESSION: Status post rod and screw fixation of the right tibia without acute
complication.
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
## 2022-04-17 IMAGING — RF DG C-ARM 1-60 MIN
1 series · 10 of 10 positions shown · non-contrast
Comparison: 03/18/2021

FLUOROSCOPY TIME:  1 minutes 3 seconds

Dose: 2.96 mGy

Images: 10

CLINICAL DATA: RIGHT tibial nailing

EXAM:
RIGHT TIBIA AND FIBULA - 2 VIEW; DG C-ARM 1-60 MIN

[Series 1: run · 10 of 10 slices shown]
[im 1/10]
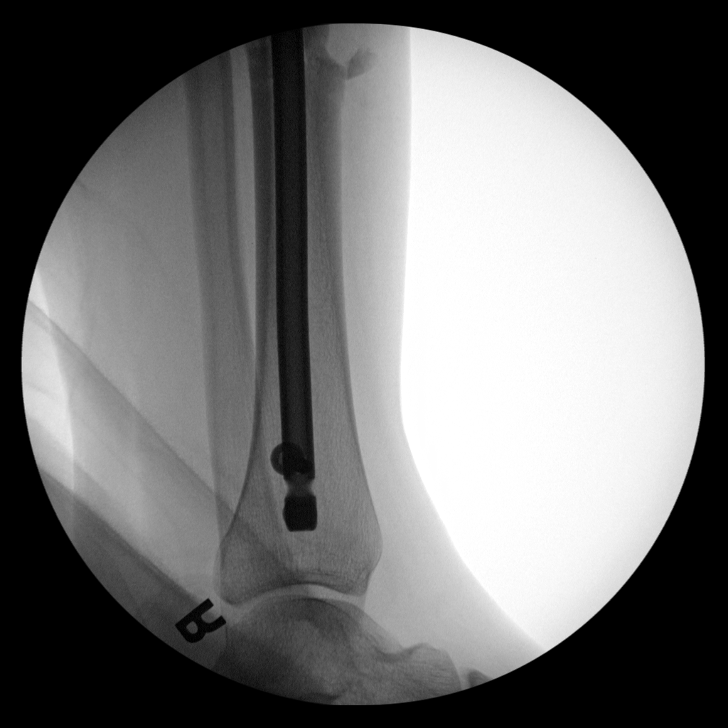
[im 2/10]
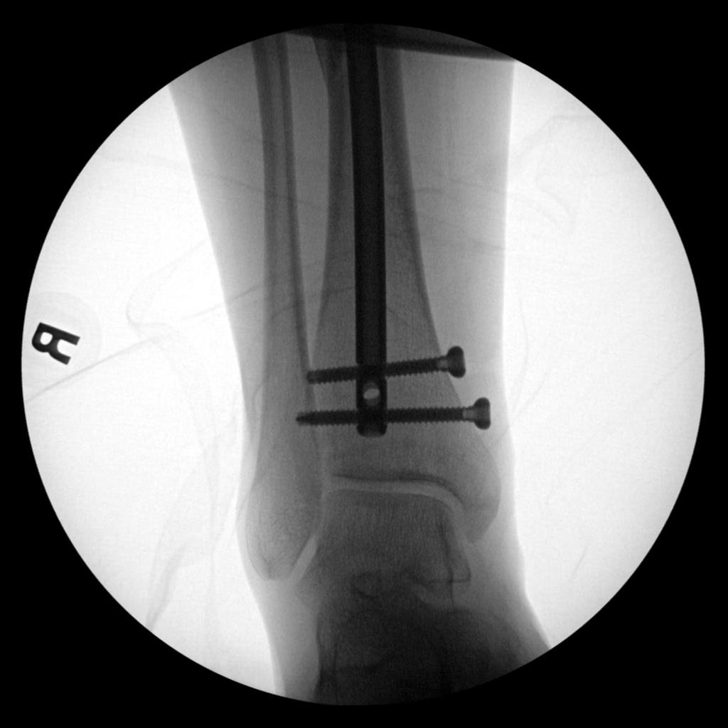
[im 3/10]
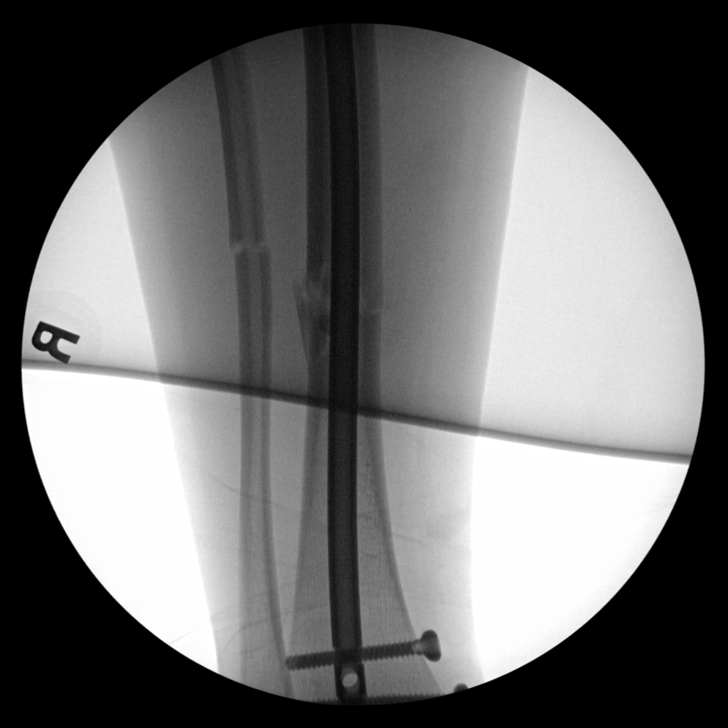
[im 4/10]
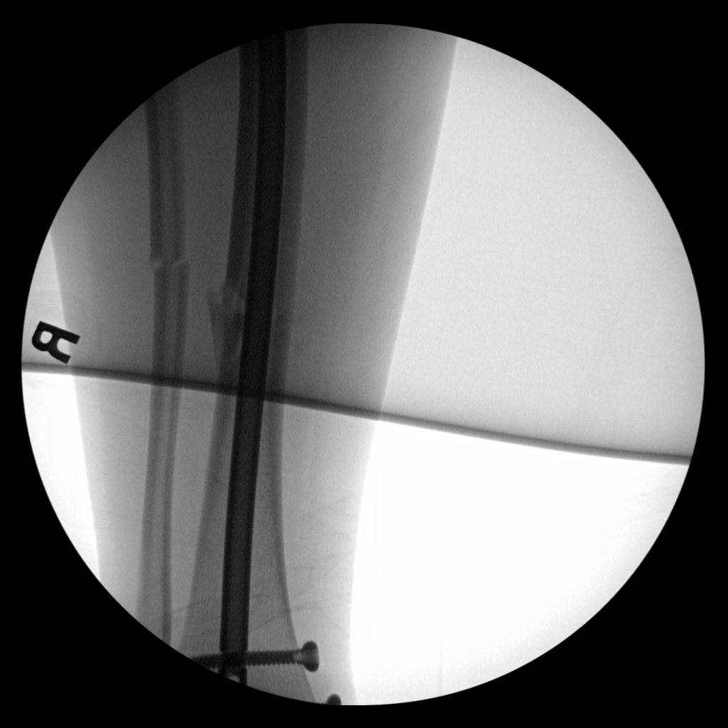
[im 5/10]
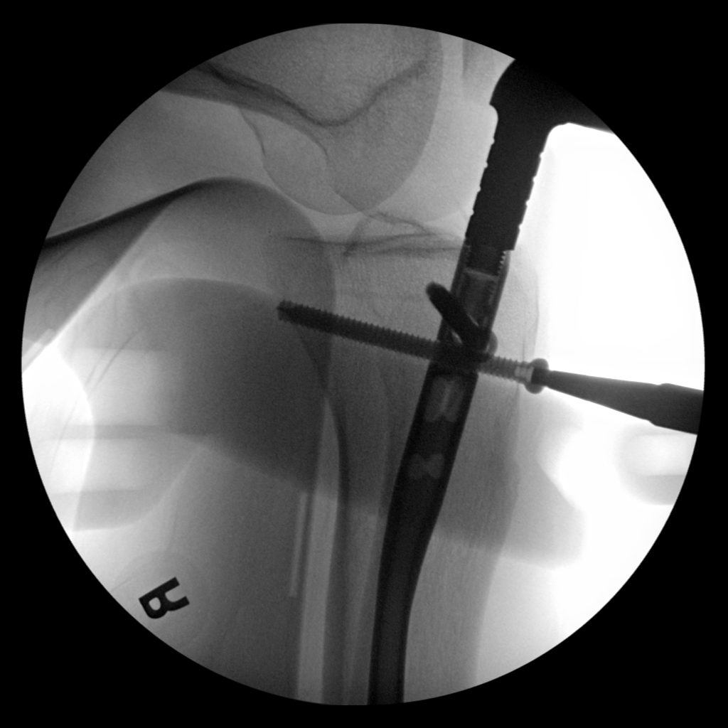
[im 6/10]
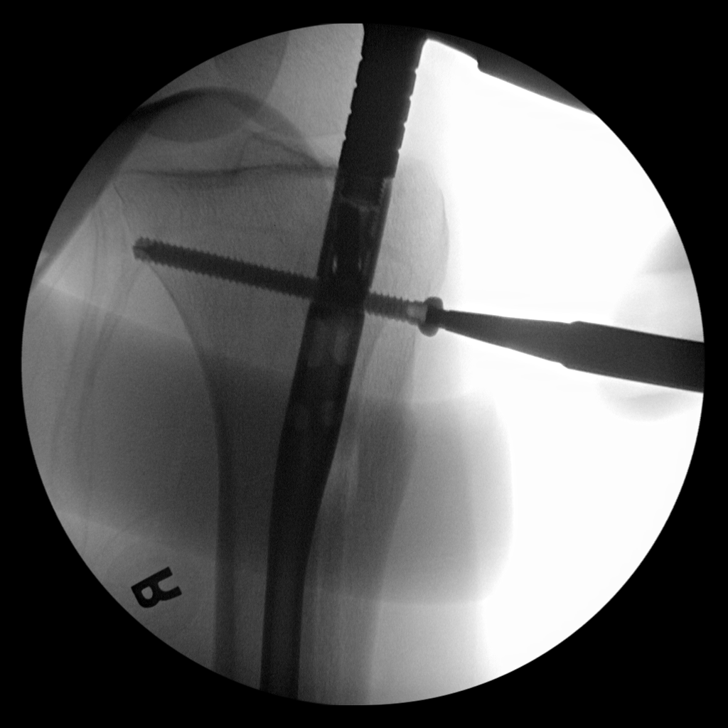
[im 7/10]
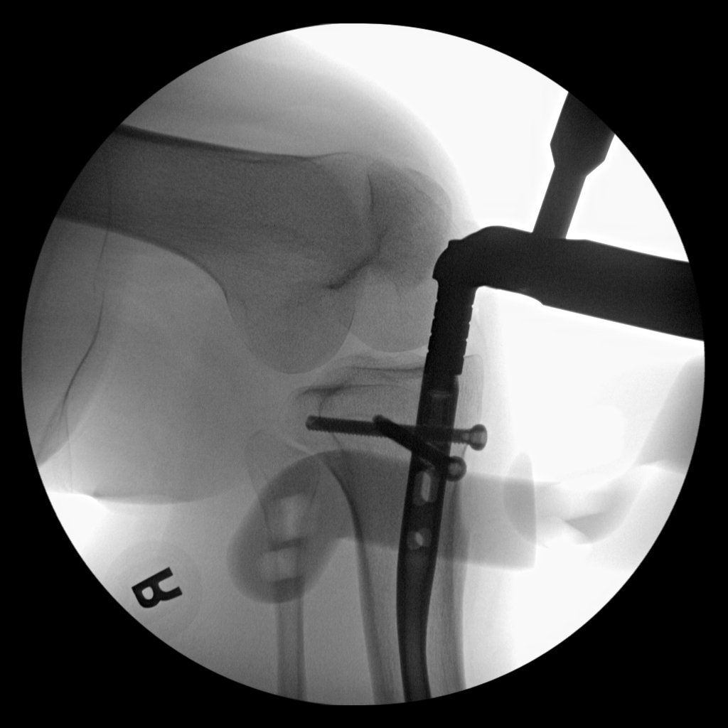
[im 8/10]
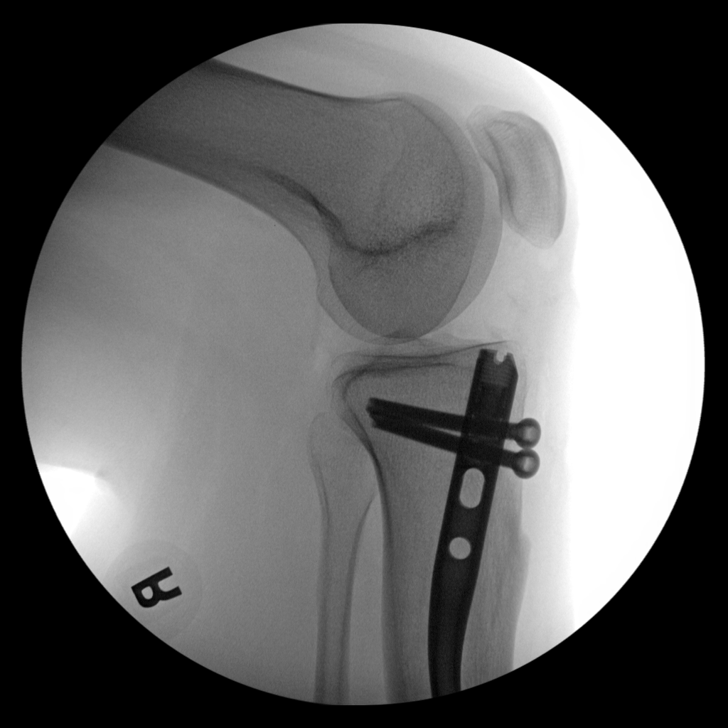
[im 9/10]
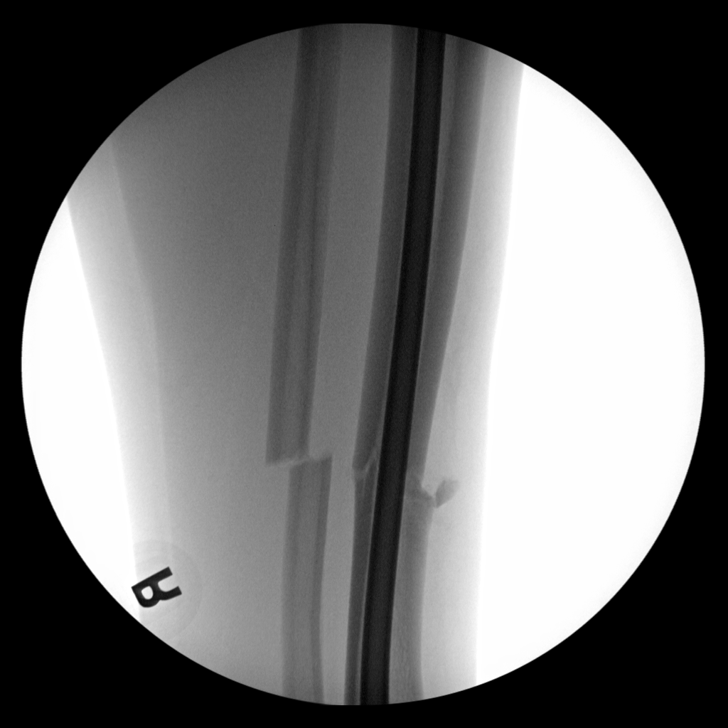
[im 10/10]
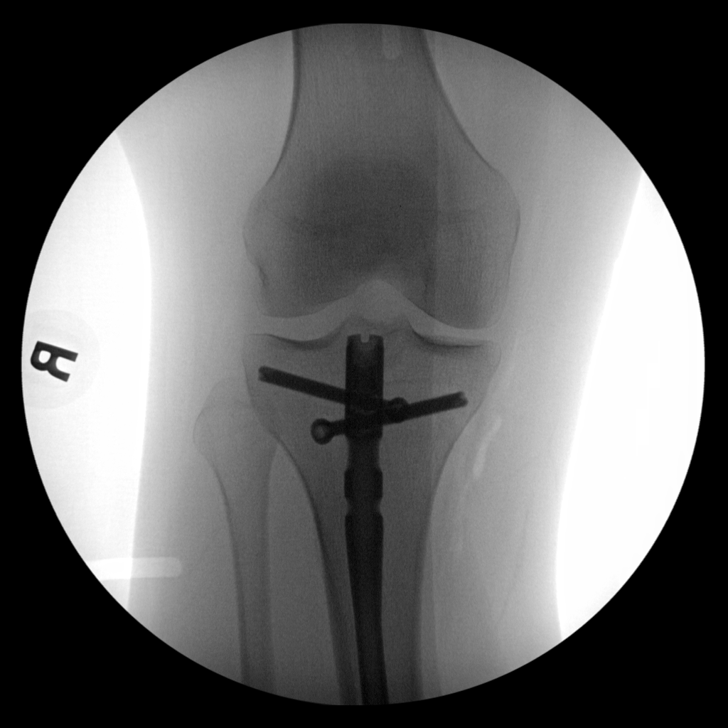

[10 of 10 positions shown; findings below may reference images not displayed]

FINDINGS: IM nail placed within RIGHT tibia across a mid diaphyseal fracture.

Proximal and distal locking screws present.

Displaced mid RIGHT fibular diaphyseal fracture identified as well.

Knee and ankle joint alignments normal.
IMPRESSION: Post nailing of RIGHT tibial diaphyseal fracture.

Mildly displaced mid RIGHT fibular diaphyseal fracture.

## 2022-04-18 IMAGING — US US OB COMP LESS 14 WK
1 series · 15 of 28 positions shown · non-contrast
Comparison: March 17, 2021

CLINICAL DATA: Assessment for viability

EXAM:
OBSTETRIC <14 WK ULTRASOUND
TECHNIQUE: Transabdominal ultrasound was performed for evaluation of the
gestation as well as the maternal uterus and adnexal regions.

[Series 1: us ob comp less 14 wk · 28 acquisitions, 15 frames shown]
[im 1/28]
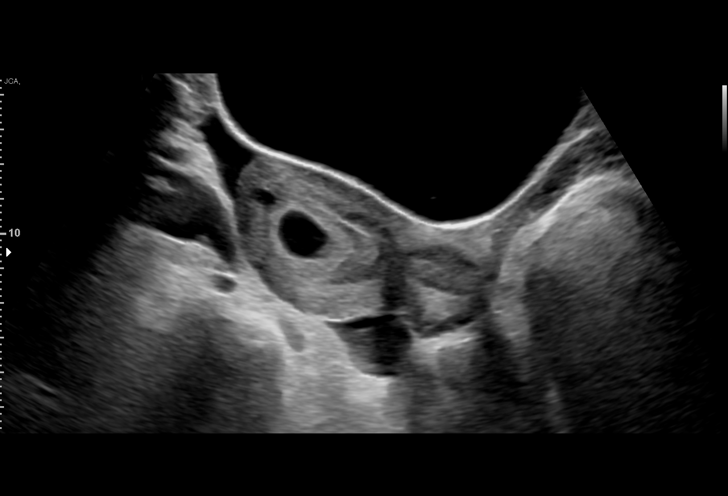
[im 3/28]
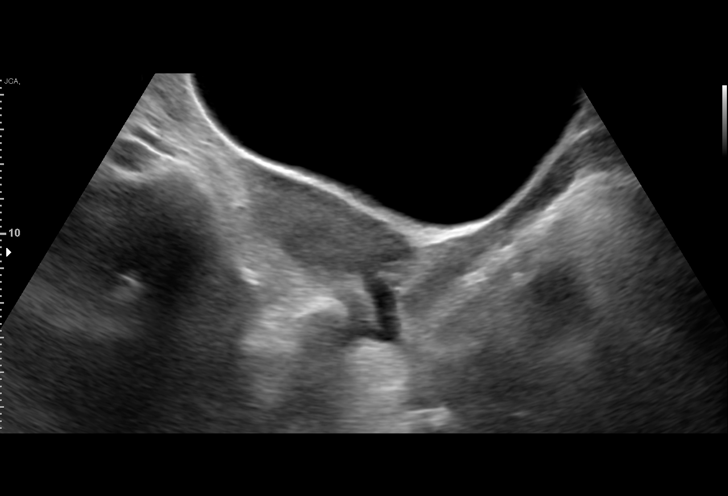
[im 5/28]
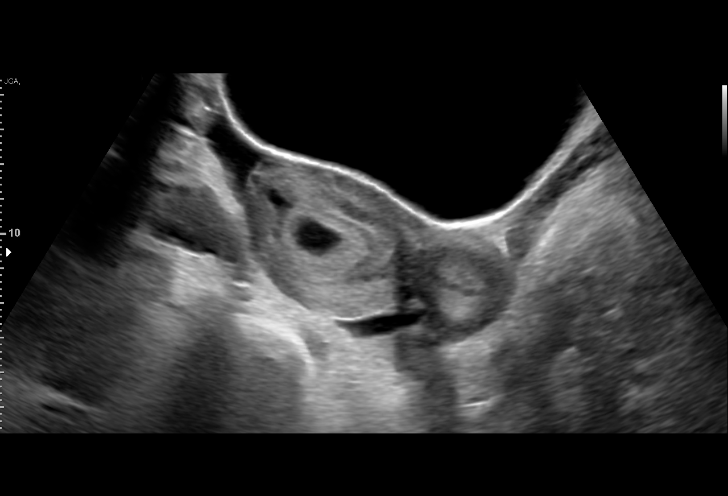
[im 7/28]
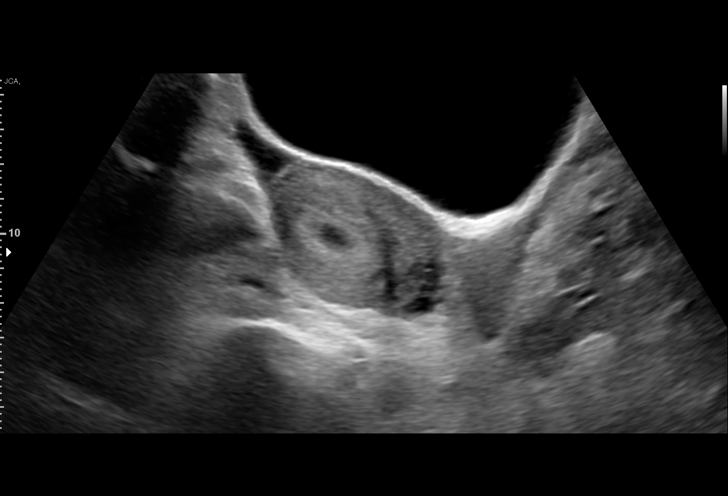
[im 9/28]
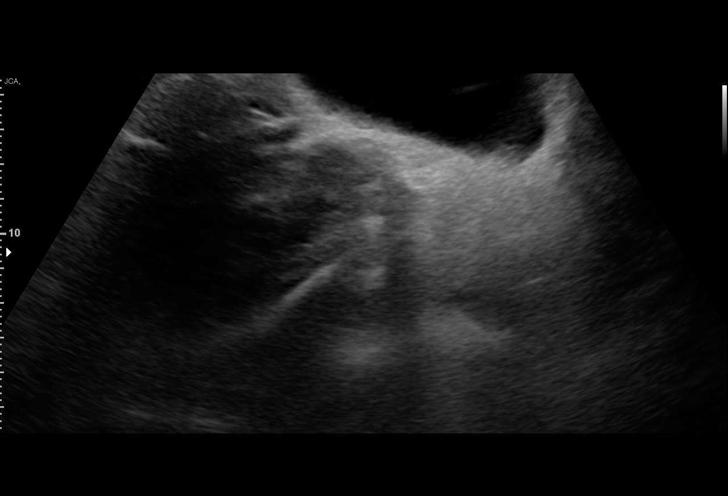
[im 11/28]
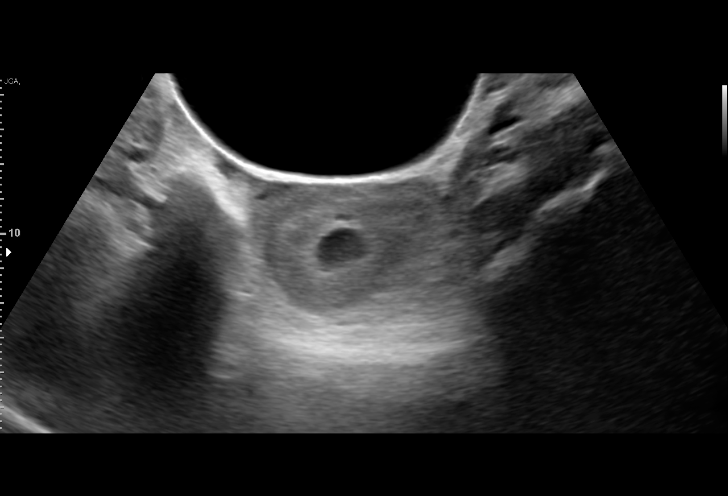
[im 13/28]
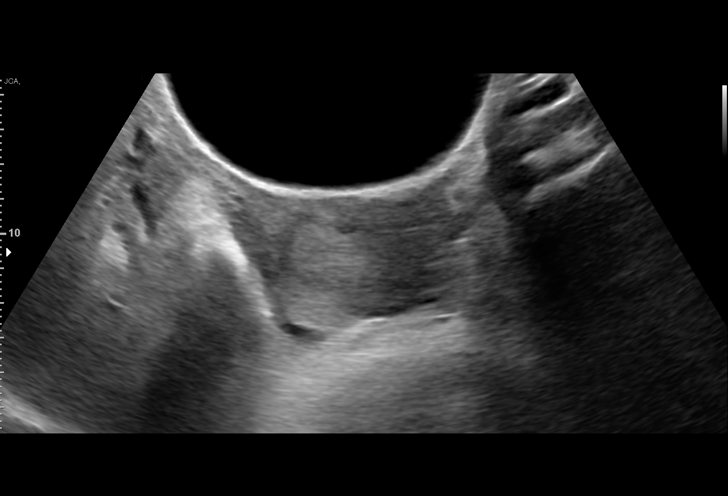
[im 15/28]
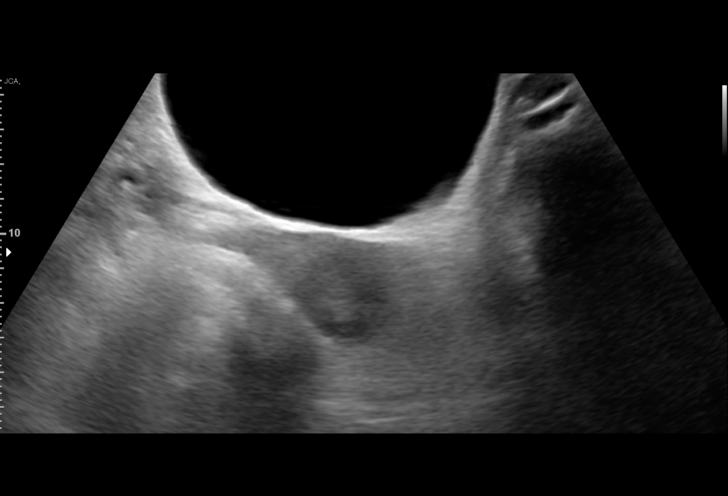
[im 16/28]
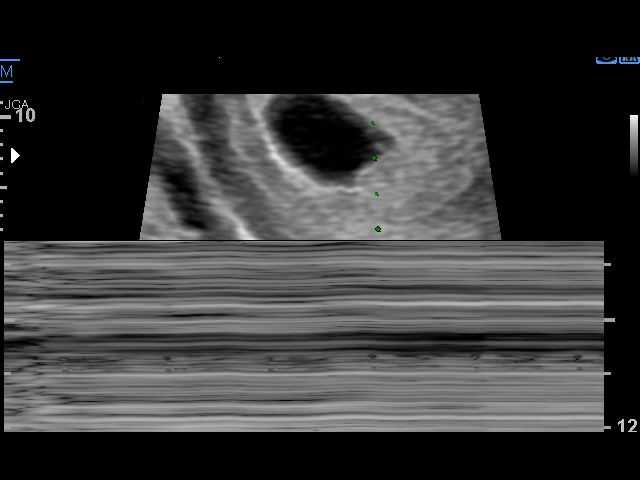
[im 18/28]
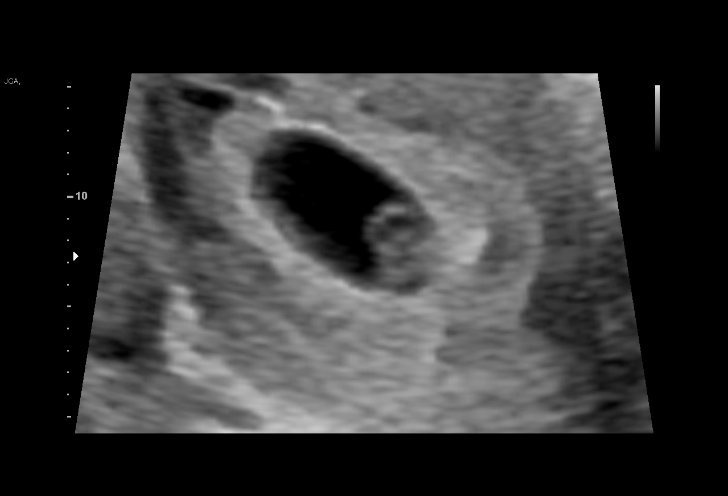
[im 20/28]
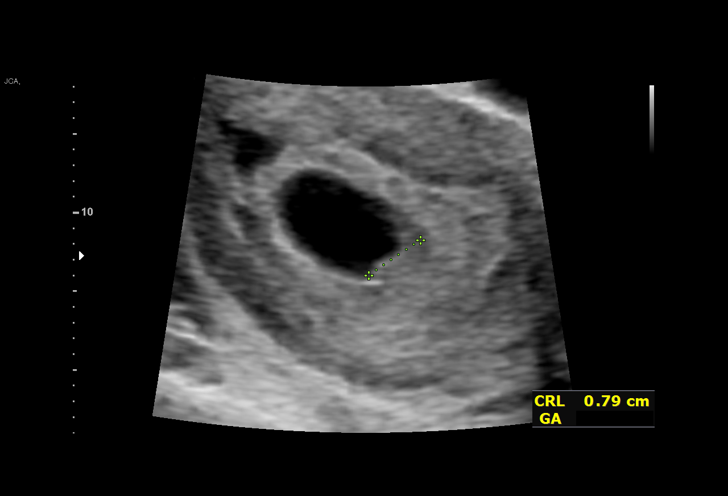
[im 22/28]
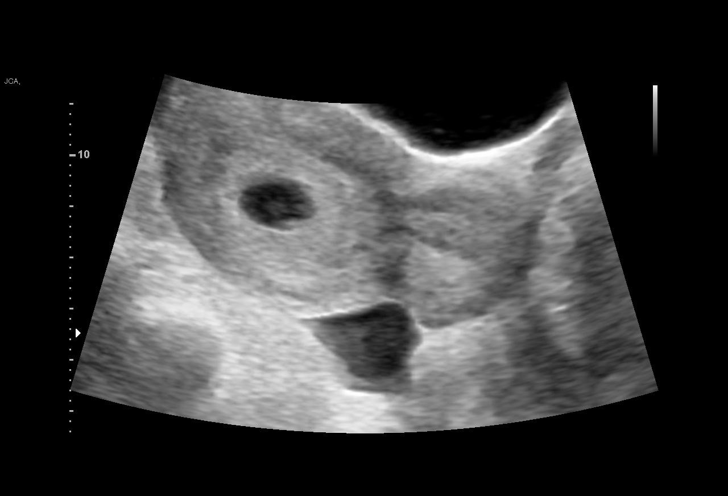
[im 24/28]
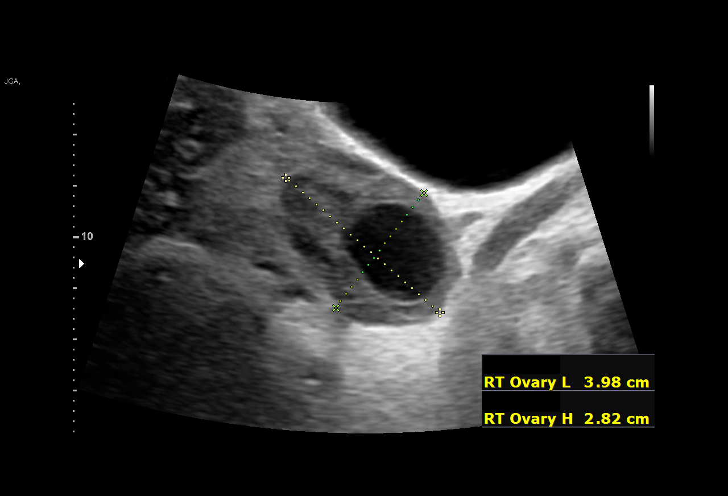
[im 26/28]
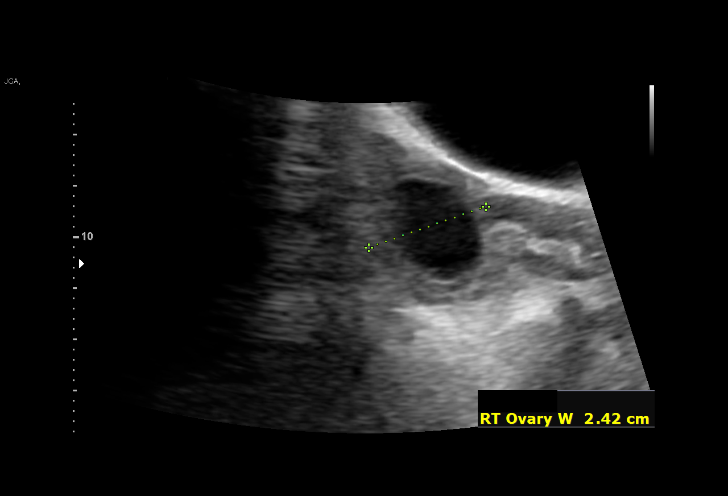
[im 28/28]
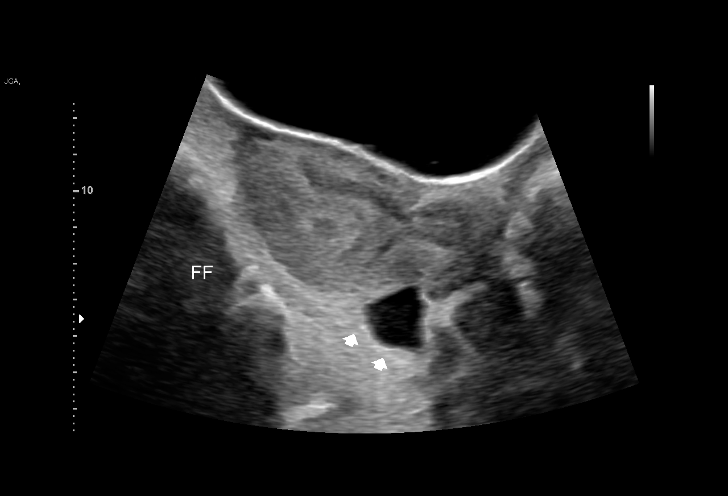

[15 of 28 positions shown; findings below may reference images not displayed]

FINDINGS: Intrauterine gestational sac: Visualized-single

Yolk sac:  Visualized

Embryo:  Visualized

Cardiac Activity: Visualized

Heart Rate: 140 bpm

CRL:   8 mm   6 w 4 d                  US EDC: November 08, 2021

Subchorionic hemorrhage: Subchorionic hemorrhage measuring 1.1 x
cm.

Maternal uterus/adnexae: Cervical os closed. No appreciable
extrauterine pelvic mass. Small amount of fluid in cul-de-sac
region.
IMPRESSION: Single live intrauterine gestation with estimated gestational age of
6+ weeks. Fetal heart activity noted with measured fetal heart rate
140 beats per minute.

Subchorionic hemorrhage measuring 1.1 x 0.9 cm.

No extrauterine pelvic mass appreciable. Small amount of fluid in
the cul-de-sac noted.

## 2022-05-25 ENCOUNTER — Other Ambulatory Visit: Payer: Self-pay | Admitting: *Deleted

## 2022-05-25 ENCOUNTER — Encounter: Payer: Self-pay | Admitting: Advanced Practice Midwife

## 2022-05-26 ENCOUNTER — Ambulatory Visit (INDEPENDENT_AMBULATORY_CARE_PROVIDER_SITE_OTHER): Payer: Medicaid Other

## 2022-05-26 ENCOUNTER — Ambulatory Visit: Payer: Medicaid Other

## 2022-05-26 VITALS — Wt 201.5 lb

## 2022-05-26 DIAGNOSIS — Z3042 Encounter for surveillance of injectable contraceptive: Secondary | ICD-10-CM | POA: Diagnosis not present

## 2022-05-26 MED ORDER — MEDROXYPROGESTERONE ACETATE 150 MG/ML IM SUSP
150.0000 mg | INTRAMUSCULAR | Status: DC
  Administered 2022-05-26: 150 mg via INTRAMUSCULAR

## 2022-05-26 NOTE — Progress Notes (Signed)
Pt is in the office for depo injection. Administered in [place] and pt tolerated well. Next Due Oct 3-17.  .. Administrations This Visit     medroxyPROGESTERone (DEPO-PROVERA) injection 150 mg     Admin Date 05/26/2022 Action Given Dose 150 mg Route Intramuscular Administered By Katrina Stack, RN

## 2022-08-17 ENCOUNTER — Ambulatory Visit: Payer: Medicaid Other

## 2022-08-17 ENCOUNTER — Ambulatory Visit (INDEPENDENT_AMBULATORY_CARE_PROVIDER_SITE_OTHER): Payer: Medicaid Other | Admitting: *Deleted

## 2022-08-17 VITALS — BP 113/78 | HR 128

## 2022-08-17 DIAGNOSIS — Z3042 Encounter for surveillance of injectable contraceptive: Secondary | ICD-10-CM

## 2022-08-17 MED ORDER — MEDROXYPROGESTERONE ACETATE 150 MG/ML IM SUSP
150.0000 mg | Freq: Once | INTRAMUSCULAR | Status: AC
  Administered 2022-08-17: 150 mg via INTRAMUSCULAR

## 2022-08-17 NOTE — Progress Notes (Signed)
Date last pap: 05/26/21, Postpartum visit 12/15/21. Last Depo-Provera: 05/26/22. Side Effects if any: NA. Serum HCG indicated? NA. Depo-Provera 150 mg IM given by: Lillia Abed. Lazarus Salines, RNC in LD. Next appointment due 11/02/22-11/16/22.

## 2022-09-05 ENCOUNTER — Encounter (HOSPITAL_BASED_OUTPATIENT_CLINIC_OR_DEPARTMENT_OTHER): Payer: Self-pay

## 2022-09-05 ENCOUNTER — Emergency Department (HOSPITAL_BASED_OUTPATIENT_CLINIC_OR_DEPARTMENT_OTHER)
Admission: EM | Admit: 2022-09-05 | Discharge: 2022-09-05 | Disposition: A | Discharge status: Home / Self Care | Payer: Medicaid Other | Attending: Emergency Medicine | Admitting: Emergency Medicine

## 2022-09-05 ENCOUNTER — Other Ambulatory Visit: Payer: Self-pay

## 2022-09-05 DIAGNOSIS — R0981 Nasal congestion: Secondary | ICD-10-CM | POA: Diagnosis present

## 2022-09-05 DIAGNOSIS — H6690 Otitis media, unspecified, unspecified ear: Secondary | ICD-10-CM

## 2022-09-05 DIAGNOSIS — J0100 Acute maxillary sinusitis, unspecified: Secondary | ICD-10-CM | POA: Insufficient documentation

## 2022-09-05 DIAGNOSIS — H6692 Otitis media, unspecified, left ear: Secondary | ICD-10-CM | POA: Diagnosis not present

## 2022-09-05 MED ORDER — PSEUDOEPHEDRINE HCL ER 120 MG PO TB12: 120.0000 mg | ORAL_TABLET | Freq: Two times a day (BID) | ORAL | 0 refills | Status: AC

## 2022-09-05 MED ORDER — AZITHROMYCIN 250 MG PO TABS: 250.0000 mg | ORAL_TABLET | Freq: Every day | ORAL | 0 refills | Status: DC

## 2022-09-05 MED ORDER — IBUPROFEN 600 MG PO TABS: 600.0000 mg | ORAL_TABLET | Freq: Three times a day (TID) | ORAL | 0 refills | Status: AC | PRN

## 2022-09-05 NOTE — ED Triage Notes (Signed)
Pt c/o nasal congestion, right sided facial pain and some fullness in her left ear. She believes she may have a sinus infection.

## 2022-09-05 NOTE — ED Provider Notes (Signed)
[facility] EMERGENCY DEPT Provider Note   CSN: [id number] Arrival date & time: 09/05/22  7829     History  Chief Complaint  Patient presents with   Nasal Congestion   Ear Fullness    Michelle Bird is a 26 y.o. female.   Ear Fullness     Patient has history of anxiety, reflux and presents to the ED with complaints of a sinus infection.  Patient states she has had symptoms for the last week.  She has had nasal congestion and fullness in her sinuses.  She is having pain on the right side of her face and her tooth is hurting.  She also has an earache the left side.  She has had low-grade temperatures up to 100.  No shortness of breath.  She has tried over-the-counter medications and the symptoms have persisted  Home Medications Prior to Admission medications   Medication Sig Start Date End Date Taking? Authorizing Provider  azithromycin (ZITHROMAX) 250 MG tablet Take 1 tablet (250 mg total) by mouth daily. Take first 2 tablets together, then 1 every day until finished. 09/05/22  Yes Linwood Dibbles, MD  ibuprofen (ADVIL) 600 MG tablet Take 1 tablet (600 mg total) by mouth every 8 (eight) hours as needed for up to 7 days. 09/05/22 09/12/22 Yes Linwood Dibbles, MD  pseudoephedrine (SUDAFED 12 HOUR) 120 MG 12 hr tablet Take 1 tablet (120 mg total) by mouth 2 (two) times daily for 7 days. 09/05/22 09/12/22 Yes Linwood Dibbles, MD  acetaminophen (TYLENOL) 500 MG tablet Take 2 tablets (1,000 mg total) by mouth every 8 (eight) hours. Patient not taking: Reported on 12/15/2021 11/06/21   Warner Mccreedy, MD  guaiFENesin (SM TUSSIN MUCUS+CHEST CONGEST) 100 MG/5ML liquid Take 5 mLs by mouth every 4 (four) hours as needed for cough or to loosen phlegm. Patient not taking: Reported on 05/26/2022 01/26/22   Tanda Rockers A, DO  medroxyPROGESTERone (DEPO-PROVERA) 150 MG/ML injection Inject 1 mL (150 mg total) into the muscle every 3 (three) months. 12/15/21   Leftwich-Kirby, Wilmer Floor, CNM   menthol-cetylpyridinium (CEPACOL) 3 MG lozenge Take 1 lozenge (3 mg total) by mouth as needed for sore throat. Patient not taking: Reported on 05/26/2022 01/26/22   Tanda Rockers A, DO  pantoprazole (PROTONIX) 20 MG tablet TAKE 2 TABLETS (40 MG TOTAL) BY MOUTH DAILY. Patient not taking: Reported on 05/26/2022 12/04/21   Reva Bores, MD      Allergies    Amoxicillin and Other    Review of Systems   Review of Systems  Physical Exam Updated Vital Signs BP (!) 146/86   Pulse 99   Temp 98.2 F (36.8 C)   Resp 18   Ht 1.702 m (5\' 7" )   Wt 93 kg   SpO2 96%   BMI 32.11 kg/m  Physical Exam Vitals and nursing note reviewed.  Constitutional:      General: She is not in acute distress.    Appearance: She is well-developed.  HENT:     Head: Normocephalic and atraumatic.     Right Ear: External ear normal.     Left Ear: External ear normal. A middle ear effusion is present. Tympanic membrane is injected and erythematous.     Mouth/Throat:     Dentition: Normal dentition.  Eyes:     General: No scleral icterus.       Right eye: No discharge.        Left eye: No discharge.     Conjunctiva/sclera: Conjunctivae  normal.  Neck:     Trachea: No tracheal deviation.  Cardiovascular:     Rate and Rhythm: Normal rate and regular rhythm.  Pulmonary:     Effort: Pulmonary effort is normal. No respiratory distress.     Breath sounds: Normal breath sounds. No stridor. No wheezing or rales.  Abdominal:     General: Bowel sounds are normal. There is no distension.     Palpations: Abdomen is soft.     Tenderness: There is no abdominal tenderness. There is no guarding or rebound.  Musculoskeletal:        General: No tenderness or deformity.     Cervical back: Neck supple.  Skin:    General: Skin is warm and dry.     Findings: No rash.  Neurological:     General: No focal deficit present.     Mental Status: She is alert.     Cranial Nerves: No cranial nerve deficit (no facial droop,  extraocular movements intact, no slurred speech).     Sensory: No sensory deficit.     Motor: No abnormal muscle tone or seizure activity.     Coordination: Coordination normal.  Psychiatric:        Mood and Affect: Mood normal.     ED Results / Procedures / Treatments   Labs (all labs ordered are listed, but only abnormal results are displayed) Labs Reviewed - No data to display  EKG None  Radiology No results found.  Procedures Procedures    Medications Ordered in ED Medications - No data to display  ED Course/ Medical Decision Making/ A&P                           Medical Decision Making Problems Addressed: Acute maxillary sinusitis, recurrence not specified: acute illness or injury that poses a threat to life or bodily functions Acute otitis media, unspecified otitis media type: acute illness or injury that poses a threat to life or bodily functions  Risk OTC drugs. Prescription drug management.   Patient presents to the ED for evaluation of URI symptoms, sinus tenderness and an earache.  Patient is also complaining of tooth ache.  On exam I do not see any acute dental abnormalities.  She does have sinus tenderness.  She also appears to have otitis media.  Possible this could be a viral or bacterial sinusitis.  However she does appear to have an otitis media sourced we will start her on a course of antibiotics.  Discussed outpatient follow-up with her primary care doctor and evaluation by a dentist if her dental pain persist after the sinus symptoms improved.  Lungs are clear.  No respiratory difficulty.  No signs of systemic infection  Evaluation and diagnostic testing in the emergency department does not suggest an emergent condition requiring admission or immediate intervention beyond what has been performed at this time.  The patient is safe for discharge and has been instructed to return immediately for worsening symptoms, change in symptoms or any other  concerns.       Final Clinical Impression(s) / ED Diagnoses Final diagnoses:  Acute maxillary sinusitis, recurrence not specified  Acute otitis media, unspecified otitis media type    Rx / DC Orders ED Discharge Orders          Ordered    azithromycin (ZITHROMAX) 250 MG tablet  Daily        09/05/22 0937    pseudoephedrine (SUDAFED 12 HOUR) 120  MG 12 hr tablet  2 times daily        09/05/22 0937    ibuprofen (ADVIL) 600 MG tablet  Every 8 hours PRN        09/05/22 1194              Dorie Rank, MD 09/05/22 [phone]

## 2022-09-05 NOTE — Discharge Instructions (Addendum)
Take the medications as prescribed.  Consider seeing a dentist if you can continue to have dental pain after your sinus symptoms resolved.  Follow-up with your doctor next week to be rechecked if the symptoms have not improved

## 2022-11-10 ENCOUNTER — Ambulatory Visit: Payer: Medicaid Other

## 2023-01-07 ENCOUNTER — Encounter (HOSPITAL_BASED_OUTPATIENT_CLINIC_OR_DEPARTMENT_OTHER): Payer: Self-pay

## 2023-01-07 ENCOUNTER — Emergency Department (HOSPITAL_BASED_OUTPATIENT_CLINIC_OR_DEPARTMENT_OTHER)
Admission: EM | Admit: 2023-01-07 | Discharge: 2023-01-07 | Disposition: A | Discharge status: Home / Self Care | Payer: Managed Care, Other (non HMO) | Attending: Emergency Medicine | Admitting: Emergency Medicine

## 2023-01-07 DIAGNOSIS — Z1152 Encounter for screening for COVID-19: Secondary | ICD-10-CM | POA: Insufficient documentation

## 2023-01-07 DIAGNOSIS — B349 Viral infection, unspecified: Secondary | ICD-10-CM | POA: Diagnosis not present

## 2023-01-07 DIAGNOSIS — R509 Fever, unspecified: Secondary | ICD-10-CM | POA: Diagnosis present

## 2023-01-07 DIAGNOSIS — R000 Tachycardia, unspecified: Secondary | ICD-10-CM | POA: Diagnosis not present

## 2023-01-07 LAB — RESP PANEL BY RT-PCR (RSV, FLU A&B, COVID)  RVPGX2
Influenza A by PCR: NEGATIVE
Influenza B by PCR: NEGATIVE
Resp Syncytial Virus by PCR: NEGATIVE
SARS Coronavirus 2 by RT PCR: NEGATIVE

## 2023-01-07 LAB — GROUP A STREP BY PCR: Group A Strep by PCR: NOT DETECTED

## 2023-01-07 MED ORDER — ACETAMINOPHEN 500 MG PO TABS
1000.0000 mg | ORAL_TABLET | Freq: Once | ORAL | Status: AC
  Administered 2023-01-07: 1000 mg via ORAL
  Filled 2023-01-07: qty 2

## 2023-01-07 MED ORDER — ACETAMINOPHEN 500 MG PO TABS: 1000.0000 mg | ORAL_TABLET | Freq: Three times a day (TID) | ORAL | 0 refills | Status: DC | PRN

## 2023-01-07 MED ORDER — GUAIFENESIN 100 MG/5ML PO LIQD: 5.0000 mL | ORAL | 0 refills | Status: DC | PRN

## 2023-01-07 NOTE — ED Provider Notes (Signed)
[facility] Provider Note   CSN: [id number] Arrival date & time: 01/07/23  1751     History  Chief Complaint  Patient presents with   URI    Michelle Bird is a 27 y.o. female.  The history is provided by the patient and medical records. No language interpreter was used.  URI    27 year old female significant history of anxiety, panic attack, who presents with flulike symptoms.  Patient reports since yesterday she has had fever as high as 101, chills, body aches, headache, congestion, sore throat, mild nausea, and decrease in appetite.  Her symptoms do persist throughout the day today prompting this ER visit.  She does not endorse any vomiting or diarrhea no dysuria no shortness of breath and no productive cough.  She denies any recent sick contact.  No history of asthma or COPD.  Home Medications Prior to Admission medications   Medication Sig Start Date End Date Taking? Authorizing Provider  acetaminophen (TYLENOL) 500 MG tablet Take 2 tablets (1,000 mg total) by mouth every 8 (eight) hours. Patient not taking: Reported on 12/15/2021 11/06/21   Renard Matter, MD  azithromycin (ZITHROMAX) 250 MG tablet Take 1 tablet (250 mg total) by mouth daily. Take first 2 tablets together, then 1 every day until finished. 09/05/22   Dorie Rank, MD  guaiFENesin (SM TUSSIN MUCUS+CHEST CONGEST) 100 MG/5ML liquid Take 5 mLs by mouth every 4 (four) hours as needed for cough or to loosen phlegm. Patient not taking: Reported on 05/26/2022 01/26/22   Wynona Dove A, DO  medroxyPROGESTERone (DEPO-PROVERA) 150 MG/ML injection Inject 1 mL (150 mg total) into the muscle every 3 (three) months. 12/15/21   Leftwich-Kirby, Kathie Dike, CNM  menthol-cetylpyridinium (CEPACOL) 3 MG lozenge Take 1 lozenge (3 mg total) by mouth as needed for sore throat. Patient not taking: Reported on 05/26/2022 01/26/22   Wynona Dove A, DO  pantoprazole (PROTONIX) 20 MG tablet TAKE 2 TABLETS (40 MG  TOTAL) BY MOUTH DAILY. Patient not taking: Reported on 05/26/2022 12/04/21   Donnamae Jude, MD      Allergies    Amoxicillin and Other    Review of Systems   Review of Systems  All other systems reviewed and are negative.   Physical Exam Updated Vital Signs BP (!) 135/93   Pulse (!) 124   Temp (!) 97.5 F (36.4 C)   Resp 16   SpO2 100%  Physical Exam Vitals and nursing note reviewed.  Constitutional:      General: She is not in acute distress.    Appearance: She is well-developed.  HENT:     Head: Atraumatic.     Nose: Nose normal.     Mouth/Throat:     Mouth: Mucous membranes are moist.  Eyes:     Conjunctiva/sclera: Conjunctivae normal.  Cardiovascular:     Rate and Rhythm: Tachycardia present.  Pulmonary:     Effort: Pulmonary effort is normal.     Breath sounds: No wheezing, rhonchi or rales.  Abdominal:     Palpations: Abdomen is soft.     Tenderness: There is no abdominal tenderness.  Musculoskeletal:        General: Normal range of motion.     Cervical back: Neck supple. No rigidity.  Skin:    General: Skin is warm.     Findings: No rash.  Neurological:     Mental Status: She is alert and oriented to person, place, and time.  Psychiatric:  Mood and Affect: Mood normal.     ED Results / Procedures / Treatments   Labs (all labs ordered are listed, but only abnormal results are displayed) Labs Reviewed  RESP PANEL BY RT-PCR (RSV, FLU A&B, COVID)  [id number]  GROUP A STREP BY PCR    EKG None  Radiology No results found.  Procedures Procedures    Medications Ordered in ED Medications  acetaminophen (TYLENOL) tablet 1,000 mg (1,000 mg Oral Given 01/07/23 2015)    ED Course/ Medical Decision Making/ A&P                             Medical Decision Making Risk OTC drugs.   BP (!) 135/93   Pulse (!) 124   Temp (!) 97.5 F (36.4 C)   Resp 16   SpO2 100%   52:65 PM 27 year old female significant history of anxiety, panic attack,  who presents with flulike symptoms.  Patient reports since yesterday she has had fever as high as 101, chills, body aches, headache, congestion, sore throat, mild nausea, and decrease in appetite.  Her symptoms do persist throughout the day today prompting this ER visit.  She does not endorse any vomiting or diarrhea no dysuria no shortness of breath and no productive cough.  She denies any recent sick contact.  On exam patient is overall well-appearing.  Normal ear nose and throat.  She is tachycardic with elevated heart rate with no murmur rubs or gallops, lungs are clear to auscultation bilaterally.  Abdomen soft nontender.  Skin is warm to the touch.  Vital signs remarkable for elevated heart rate of 124 but patient is afebrile.  Normal blood pressure.  No hypoxia.  -Labs ordered, independently viewed and interpreted by me.  Labs remarkable for negative strep, negative covid/flu/rsv -The patient was maintained on a cardiac monitor.  I personally viewed and interpreted the cardiac monitored which showed an underlying rhythm of: sinus tachycardia -Imaging including CXR considered but not performed as pt denies SOB, her lungs are clear on exam and no evidence of hypoxia -This patient presents to the ED for concern of cold sxs, this involves an extensive number of treatment options, and is a complaint that carries with it a high risk of complications and morbidity.  The differential diagnosis includes covid, flu, rsv, pna, viral illness, thyroid storm, cardiac arrhythmia -Co morbidities that complicate the patient evaluation includes anxiety -Treatment includes tylenol -Reevaluation of the patient after these medicines showed that the patient improved -PCP office notes or outside notes reviewed -Escalation to admission/observation considered: patients feels much better, is comfortable with discharge, and will follow up with PCP -Prescription medication considered, patient comfortable with tylenol and  OTC supportive care -Social Determinant of Health considered   8:57 PM Patient presents with flulike symptoms.  Strep test, negative, she is also tested negative for COVID flu and RSV.  I have considered other potential thyroid etiology but my suspicion is low as patient denies any heat or cold insensitivity, neck pain, or other symptoms suggestive of thyroid disease.  I also have low suspicion for PE as his symptoms more suggestive of a viral illness.  I recommend supportive care and provided work note.  I also gave patient return precaution patient voiced understanding and agrees with plan.         Final Clinical Impression(s) / ED Diagnoses Final diagnoses:  Viral illness    Rx / DC Orders ED Discharge Orders  Ordered    acetaminophen (TYLENOL) 500 MG tablet  Every 8 hours PRN        01/07/23 2101    guaiFENesin (SM TUSSIN MUCUS+CHEST CONGEST) 100 MG/5ML liquid  Every 4 hours PRN        01/07/23 2101              Domenic Moras, PA-C 01/07/23 2102    Elgie Congo, MD 01/07/23 [phone]

## 2023-01-07 NOTE — ED Triage Notes (Signed)
Pt c/o sore throat, fever since last night, sinus pain

## 2023-01-07 NOTE — Discharge Instructions (Addendum)
Although you test negative for COVID, flu, RSV, and strep, your symptoms are suggestive of a viral illness.  Please take medication prescribed, get plenty of rest, drink plenty of fluid and follow-up with your doctor for further care.  Return if any concern.

## 2023-01-07 NOTE — ED Notes (Signed)
Discharge instructions, follow up care, and prescriptions reviewed and explained, pt verbalized understanding. Pt caox4, ambulatory, NAD on d/c.

## 2023-05-30 ENCOUNTER — Emergency Department (HOSPITAL_BASED_OUTPATIENT_CLINIC_OR_DEPARTMENT_OTHER): Payer: Managed Care, Other (non HMO)

## 2023-05-30 ENCOUNTER — Emergency Department (HOSPITAL_BASED_OUTPATIENT_CLINIC_OR_DEPARTMENT_OTHER)
Admission: EM | Admit: 2023-05-30 | Discharge: 2023-05-31 | Disposition: A | Discharge status: Home / Self Care | Payer: Managed Care, Other (non HMO) | Attending: Emergency Medicine | Admitting: Emergency Medicine

## 2023-05-30 ENCOUNTER — Encounter (HOSPITAL_BASED_OUTPATIENT_CLINIC_OR_DEPARTMENT_OTHER): Payer: Self-pay

## 2023-05-30 DIAGNOSIS — R0789 Other chest pain: Secondary | ICD-10-CM

## 2023-05-30 DIAGNOSIS — J40 Bronchitis, not specified as acute or chronic: Secondary | ICD-10-CM | POA: Diagnosis not present

## 2023-05-30 DIAGNOSIS — R000 Tachycardia, unspecified: Secondary | ICD-10-CM | POA: Insufficient documentation

## 2023-05-30 DIAGNOSIS — R0602 Shortness of breath: Secondary | ICD-10-CM | POA: Diagnosis present

## 2023-05-30 DIAGNOSIS — Z20822 Contact with and (suspected) exposure to covid-19: Secondary | ICD-10-CM | POA: Diagnosis not present

## 2023-05-30 LAB — CBC
HCT: 37.7 % (ref 36.0–46.0)
Hemoglobin: 11.5 g/dL — ABNORMAL LOW (ref 12.0–15.0)
MCH: 22.3 pg — ABNORMAL LOW (ref 26.0–34.0)
MCHC: 30.5 g/dL (ref 30.0–36.0)
MCV: 73.1 fL — ABNORMAL LOW (ref 80.0–100.0)
Platelets: 413 10*3/uL — ABNORMAL HIGH (ref 150–400)
RBC: 5.16 MIL/uL — ABNORMAL HIGH (ref 3.87–5.11)
RDW: 16.4 % — ABNORMAL HIGH (ref 11.5–15.5)
WBC: 8.2 10*3/uL (ref 4.0–10.5)
nRBC: 0 % (ref 0.0–0.2)

## 2023-05-30 LAB — BASIC METABOLIC PANEL
Anion gap: 10 (ref 5–15)
BUN: 9 mg/dL (ref 6–20)
CO2: 25 mmol/L (ref 22–32)
Calcium: 9.3 mg/dL (ref 8.9–10.3)
Chloride: 103 mmol/L (ref 98–111)
Creatinine, Ser: 0.7 mg/dL (ref 0.44–1.00)
GFR, Estimated: >60 mL/min (ref >60)
Glucose, Bld: 105 mg/dL — ABNORMAL HIGH (ref 70–99)
Potassium: 3.5 mmol/L (ref 3.5–5.1)
Sodium: 138 mmol/L (ref 135–145)

## 2023-05-30 LAB — RESP PANEL BY RT-PCR (RSV, FLU A&B, COVID)  RVPGX2
Influenza A by PCR: NEGATIVE
Influenza B by PCR: NEGATIVE
Resp Syncytial Virus by PCR: NEGATIVE
SARS Coronavirus 2 by RT PCR: NEGATIVE

## 2023-05-30 LAB — GROUP A STREP BY PCR: Group A Strep by PCR: NOT DETECTED

## 2023-05-30 LAB — TROPONIN I (HIGH SENSITIVITY): Troponin I (High Sensitivity): <2 ng/L (ref <18)

## 2023-05-30 MED ORDER — IPRATROPIUM-ALBUTEROL 0.5-2.5 (3) MG/3ML IN SOLN: 3.0000 mL | Freq: Once | RESPIRATORY_TRACT | Status: AC

## 2023-05-30 MED ORDER — ALBUTEROL SULFATE (2.5 MG/3ML) 0.083% IN NEBU
INHALATION_SOLUTION | RESPIRATORY_TRACT | Status: AC
  Administered 2023-05-30: 2.5 mg via RESPIRATORY_TRACT
  Filled 2023-05-30: qty 3

## 2023-05-30 MED ORDER — IPRATROPIUM-ALBUTEROL 0.5-2.5 (3) MG/3ML IN SOLN
RESPIRATORY_TRACT | Status: AC
  Administered 2023-05-30: 3 mL via RESPIRATORY_TRACT
  Filled 2023-05-30: qty 3

## 2023-05-30 MED ORDER — ALBUTEROL SULFATE (2.5 MG/3ML) 0.083% IN NEBU: 2.5000 mg | INHALATION_SOLUTION | Freq: Once | RESPIRATORY_TRACT | Status: AC

## 2023-05-30 NOTE — ED Triage Notes (Signed)
Patient here POV from Home.  Endorses generally not feeling that has been over the past few days and then has been progressively SOB.  No known Fevers. No Discernable Cough.   NAD Noted during Triage. A&Ox4. GCS 15. Ambulatory.

## 2023-05-30 NOTE — ED Notes (Signed)
Post treatment assessment. Pt BLBS clr/dim at this time. Pt states her breathing has improved at this time. RT will continue to monitor while at [facility].    05/30/23 2005  Aerosol Therapy Tx  $ Hand Held Nebulizer  1  Medications Albuterol;Duoneb  Delivery Source Air  Delivery Device HHN  Pre-Treatment Pulse 100  Pre-Treatment Respirations 20  Post-Treatment Pulse 110  Post-Treatment Respirations 22  Treatment Tolerance Tolerated well  Treatment Given 2  MEWS Score/Color  MEWS Score 2  MEWS Score Color Yellow  RT Breath Sounds  Bilateral Breath Sounds Clear;Diminished  R Upper  Breath Sounds Clear  L Upper Breath Sounds Clear  R Lower Breath Sounds Clear;Diminished  L Lower Breath Sounds Clear;Diminished  Oxygen Therapy/Pulse Ox  O2 Device Room Air  O2 Therapy Room air  SpO2 100 %

## 2023-05-30 NOTE — ED Notes (Signed)
RT assessed pt in triage for SOB. Pt BLBS diminished throughout. Pt respiratory status stable on RA w/no distress noted at this time. RT initiated neb per protocol at this time. RT will continue to monitor while at [facility].   05/30/23 2004  Therapy Vitals  Pulse Rate (!) 103  Resp (!) 22  MEWS Score/Color  MEWS Score 2  MEWS Score Color Yellow  Respiratory Assessment  Assessment Type Pre-treatment  Respiratory Pattern Regular;Unlabored;Dyspnea at rest;Symmetrical  Chest Assessment Chest expansion symmetrical  Cough None  Bilateral Breath Sounds Diminished  R Upper  Breath Sounds Diminished  L Upper Breath Sounds Diminished  R Lower Breath Sounds Diminished  L Lower Breath Sounds Diminished  Oxygen Therapy/Pulse Ox  O2 Device Room Air  O2 Therapy Room air  SpO2 100 %

## 2023-05-31 DIAGNOSIS — J40 Bronchitis, not specified as acute or chronic: Secondary | ICD-10-CM | POA: Diagnosis not present

## 2023-05-31 LAB — PREGNANCY, URINE: Preg Test, Ur: NEGATIVE

## 2023-05-31 MED ORDER — PREDNISONE 50 MG PO TABS
60.0000 mg | ORAL_TABLET | Freq: Once | ORAL | Status: AC
  Administered 2023-05-31: 60 mg via ORAL
  Filled 2023-05-31: qty 1

## 2023-05-31 MED ORDER — PREDNISONE 10 MG (21) PO TBPK: ORAL_TABLET | ORAL | 0 refills | Status: DC

## 2023-05-31 MED ORDER — NAPROXEN 500 MG PO TABS: 500.0000 mg | ORAL_TABLET | Freq: Two times a day (BID) | ORAL | 0 refills | Status: DC

## 2023-05-31 MED ORDER — KETOROLAC TROMETHAMINE 30 MG/ML IJ SOLN
30.0000 mg | Freq: Once | INTRAMUSCULAR | Status: AC
  Administered 2023-05-31: 30 mg via INTRAMUSCULAR
  Filled 2023-05-31: qty 1

## 2023-05-31 NOTE — ED Provider Notes (Signed)
[facility] EMERGENCY DEPARTMENT AT [facility]  Provider Note  CSN: [id number] Arrival date & time: 05/30/23 1938  History Chief Complaint  Patient presents with   Shortness of Breath    Michelle Bird is a 27 y.o. female with no significant PMH reports she woke up this morning with diffuse anterior chest wall pain, worse with deep breath and coughing. She has had a viral illness the last few days with general malaise, nausea, etc. She has not had a known fever. No recent travel or leg swelling.    Home Medications Prior to Admission medications   Medication Sig Start Date End Date Taking? Authorizing Provider  naproxen (NAPROSYN) 500 MG tablet Take 1 tablet (500 mg total) by mouth 2 (two) times daily. 05/31/23  Yes Pollyann Savoy, MD  predniSONE (STERAPRED UNI-PAK 21 TAB) 10 MG (21) TBPK tablet 10mg  Tabs, 6 day taper. Use as directed 05/31/23  Yes Pollyann Savoy, MD  acetaminophen (TYLENOL) 500 MG tablet Take 2 tablets (1,000 mg total) by mouth every 8 (eight) hours as needed for fever, moderate pain or headache. 01/07/23   Fayrene Helper, PA-C  azithromycin (ZITHROMAX) 250 MG tablet Take 1 tablet (250 mg total) by mouth daily. Take first 2 tablets together, then 1 every day until finished. 09/05/22   Linwood Dibbles, MD  guaiFENesin (SM TUSSIN MUCUS+CHEST CONGEST) 100 MG/5ML liquid Take 5 mLs by mouth every 4 (four) hours as needed for cough or to loosen phlegm. 01/07/23   Fayrene Helper, PA-C  medroxyPROGESTERone (DEPO-PROVERA) 150 MG/ML injection Inject 1 mL (150 mg total) into the muscle every 3 (three) months. 12/15/21   Leftwich-Kirby, Wilmer Floor, CNM  pantoprazole (PROTONIX) 20 MG tablet TAKE 2 TABLETS (40 MG TOTAL) BY MOUTH DAILY. Patient not taking: Reported on 05/26/2022 12/04/21   Reva Bores, MD     Allergies    Amoxicillin and Other   Review of Systems   Review of Systems Please see HPI for pertinent positives and negatives  Physical Exam BP (!) 128/90 (BP  Location: Right Arm)   Pulse 99   Temp 97.6 F (36.4 C) (Oral)   Resp (!) 21   Ht 5\' 7"  (1.702 m)   Wt 99.8 kg   SpO2 100%   BMI 34.46 kg/m   Physical Exam Vitals and nursing note reviewed.  Constitutional:      Appearance: Normal appearance.  HENT:     Head: Normocephalic and atraumatic.     Nose: Nose normal.     Mouth/Throat:     Mouth: Mucous membranes are moist.  Eyes:     Extraocular Movements: Extraocular movements intact.     Conjunctiva/sclera: Conjunctivae normal.  Cardiovascular:     Rate and Rhythm: Normal rate.  Pulmonary:     Effort: Pulmonary effort is normal.     Breath sounds: Normal breath sounds.  Chest:     Chest wall: Tenderness (diffuse anterior, reproduces pain) present.  Abdominal:     General: Abdomen is flat.     Palpations: Abdomen is soft.     Tenderness: There is no abdominal tenderness.  Musculoskeletal:        General: No swelling. Normal range of motion.     Cervical back: Neck supple.  Skin:    General: Skin is warm and dry.  Neurological:     General: No focal deficit present.     Mental Status: She is alert.  Psychiatric:        Mood and  Affect: Mood normal.     ED Results / Procedures / Treatments   EKG EKG Interpretation Date/Time:  Sunday May 30 2023 20:06:09 EDT Ventricular Rate:  105 PR Interval:  156 QRS Duration:  74 QT Interval:  316 QTC Calculation: 417 R Axis:   73  Text Interpretation: Sinus tachycardia Otherwise normal ECG When compared with ECG of 17-Mar-2021 17:43, PREVIOUS ECG IS PRESENT No significant change since last tracing Confirmed by Madelein Mahadeo ([id number]) on 05/31/2023 1:01:55 AM  Procedures Procedures  Medications Ordered in the ED Medications  ipratropium-albuterol (DUONEB) 0.5-2.5 (3) MG/3ML nebulizer solution 3 mL (3 mLs Nebulization Given by Other 05/30/23 2004)  albuterol (PROVENTIL) (2.5 MG/3ML) 0.083% nebulizer solution 2.5 mg (2.5 mg Nebulization Given by Other 05/30/23 2004)   ketorolac (TORADOL) 30 MG/ML injection 30 mg (30 mg Intramuscular Given 05/31/23 0121)  predniSONE (DELTASONE) tablet 60 mg (60 mg Oral Given 05/31/23 0121)    Initial Impression and Plan  Patient here with chest wall pain, mild cough, recent viral illness. Likely a bronchitis. Labs done in triage show unremarkable CBC, BMP and Trop. Given duration of symptoms, repeat troponin is not indicated. Covid/Flu/RSV and strep swabs are neg. I personally viewed the images from radiology studies and agree with radiologist interpretation: CXR is clear. Low clinical suspicion for ACS or PE. Will give Toradol for pain control. Plan rx for NSAIDs and prednisone for chest wall pain and bronchitis.   ED Course       MDM Rules/Calculators/A&P Medical Decision Making Problems Addressed: Bronchitis: acute illness or injury Chest wall pain: acute illness or injury  Amount and/or Complexity of Data Reviewed Labs: ordered. Decision-making details documented in ED Course. Radiology: ordered and independent interpretation performed. Decision-making details documented in ED Course. ECG/medicine tests: ordered and independent interpretation performed. Decision-making details documented in ED Course.  Risk Prescription drug management.     Final Clinical Impression(s) / ED Diagnoses Final diagnoses:  Bronchitis  Chest wall pain    Rx / DC Orders ED Discharge Orders          Ordered    predniSONE (STERAPRED UNI-PAK 21 TAB) 10 MG (21) TBPK tablet        05/31/23 0131    naproxen (NAPROSYN) 500 MG tablet  2 times daily        07 /22/24 0131             Pollyann Savoy, MD 05/31/23 [phone]

## 2023-07-28 ENCOUNTER — Ambulatory Visit
Admission: EM | Admit: 2023-07-28 | Discharge: 2023-07-28 | Disposition: A | Discharge status: Home / Self Care | Payer: Managed Care, Other (non HMO) | Attending: Internal Medicine | Admitting: Internal Medicine

## 2023-07-28 DIAGNOSIS — R0602 Shortness of breath: Secondary | ICD-10-CM | POA: Insufficient documentation

## 2023-07-28 DIAGNOSIS — R1110 Vomiting, unspecified: Secondary | ICD-10-CM | POA: Insufficient documentation

## 2023-07-28 DIAGNOSIS — R52 Pain, unspecified: Secondary | ICD-10-CM

## 2023-07-28 DIAGNOSIS — B349 Viral infection, unspecified: Secondary | ICD-10-CM | POA: Insufficient documentation

## 2023-07-28 DIAGNOSIS — Z1152 Encounter for screening for COVID-19: Secondary | ICD-10-CM | POA: Diagnosis not present

## 2023-07-28 DIAGNOSIS — R0781 Pleurodynia: Secondary | ICD-10-CM | POA: Diagnosis not present

## 2023-07-28 DIAGNOSIS — R519 Headache, unspecified: Secondary | ICD-10-CM | POA: Diagnosis present

## 2023-07-28 LAB — POCT URINALYSIS DIP (MANUAL ENTRY)
Glucose, UA: NEGATIVE mg/dL
Ketones, POC UA: NEGATIVE mg/dL
Leukocytes, UA: NEGATIVE
Nitrite, UA: POSITIVE — AB
Protein Ur, POC: 30 mg/dL — AB
Spec Grav, UA: >=1.03 — AB (ref 1.010–1.025)
Urobilinogen, UA: 0.2 U/dL
pH, UA: 6 (ref 5.0–8.0)

## 2023-07-28 LAB — POCT URINE PREGNANCY: Preg Test, Ur: NEGATIVE

## 2023-07-28 LAB — POCT INFLUENZA A/B
Influenza A, POC: NEGATIVE
Influenza B, POC: NEGATIVE

## 2023-07-28 MED ORDER — IPRATROPIUM BROMIDE 0.03 % NA SOLN
2.0000 | Freq: Two times a day (BID) | NASAL | 0 refills | Status: DC
Start: 2023-07-28 — End: 2024-04-04

## 2023-07-28 MED ORDER — ACETAMINOPHEN 325 MG PO TABS: 650.0000 mg | ORAL_TABLET | Freq: Four times a day (QID) | ORAL | 0 refills | Status: DC | PRN

## 2023-07-28 MED ORDER — ONDANSETRON HCL 4 MG/2ML IJ SOLN
4.0000 mg | Freq: Once | INTRAMUSCULAR | Status: AC
  Administered 2023-07-28: 4 mg via INTRAVENOUS

## 2023-07-28 MED ORDER — CETIRIZINE HCL 10 MG PO TABS: 10.0000 mg | ORAL_TABLET | Freq: Every day | ORAL | 0 refills | Status: DC

## 2023-07-28 MED ORDER — SODIUM CHLORIDE 0.9 % IV BOLUS
1000.0000 mL | Freq: Once | INTRAVENOUS | Status: AC
  Administered 2023-07-28: 1000 mL via INTRAVENOUS

## 2023-07-28 MED ORDER — ONDANSETRON 8 MG PO TBDP: 8.0000 mg | ORAL_TABLET | Freq: Three times a day (TID) | ORAL | 0 refills | Status: DC | PRN

## 2023-07-28 NOTE — Discharge Instructions (Addendum)
We will notify you of your test results as they arrive and may take between about 24 hours.  I encourage you to sign up for MyChart if you have not already done so as this can be the easiest way for [place] to communicate results to you online or through a phone app.  Generally, we only contact you if it is a positive test result.  In the meantime, if you develop worsening symptoms including fever, chest pain, shortness of breath despite our current treatment plan then please report to the emergency room as this may be a sign of worsening status from possible viral infection.  Otherwise, we will manage this as a viral syndrome. For sore throat or cough try using a honey-based tea. Use 3 teaspoons of honey with juice squeezed from half lemon. Place shaved pieces of ginger into 1/2-1 cup of water and warm over stove top. Then mix the ingredients and repeat every 4 hours as needed. Please take Tylenol 650mg  every 6 hours for aches and pains, fevers. Hydrate very well with at least 2 liters of water. Eat light meals such as soups to replenish electrolytes and soft fruits, veggies. Start an antihistamine like Zyrtec (10mg  daily) for postnasal drainage, sinus congestion.  You can take this together with Atrovent 2-3 times a day as needed for the same kind of congestion.  Use the nausea and vomiting medication, ondansetron, as needed.
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
## 2023-07-28 NOTE — ED Triage Notes (Signed)
Pt c/o HA, n/v, SHOB x 2 days- feels she has a fever but has not checked-NAD-slow steady gait

## 2023-07-29 LAB — URINE CULTURE

## 2023-07-29 LAB — SARS CORONAVIRUS 2 (TAT 6-24 HRS): SARS Coronavirus 2: NEGATIVE

## 2023-10-04 ENCOUNTER — Encounter: Payer: Self-pay | Admitting: Advanced Practice Midwife

## 2024-03-06 ENCOUNTER — Ambulatory Visit: Admitting: Internal Medicine

## 2024-03-06 ENCOUNTER — Encounter: Payer: Self-pay | Admitting: Internal Medicine

## 2024-03-06 VITALS — BP 116/78 | HR 78 | Temp 98.1°F | Ht 67.0 in | Wt 222.4 lb

## 2024-03-06 DIAGNOSIS — N926 Irregular menstruation, unspecified: Secondary | ICD-10-CM | POA: Diagnosis not present

## 2024-03-06 DIAGNOSIS — E669 Obesity, unspecified: Secondary | ICD-10-CM

## 2024-03-06 DIAGNOSIS — Z8781 Personal history of (healed) traumatic fracture: Secondary | ICD-10-CM | POA: Diagnosis not present

## 2024-03-06 LAB — POCT URINE PREGNANCY: Preg Test, Ur: NEGATIVE
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
## 2024-03-21 ENCOUNTER — Other Ambulatory Visit (INDEPENDENT_AMBULATORY_CARE_PROVIDER_SITE_OTHER): Payer: Self-pay

## 2024-03-21 ENCOUNTER — Ambulatory Visit (INDEPENDENT_AMBULATORY_CARE_PROVIDER_SITE_OTHER): Admitting: Family

## 2024-03-21 DIAGNOSIS — M25371 Other instability, right ankle: Secondary | ICD-10-CM

## 2024-03-21 DIAGNOSIS — M79661 Pain in right lower leg: Secondary | ICD-10-CM

## 2024-03-21 DIAGNOSIS — G8929 Other chronic pain: Secondary | ICD-10-CM | POA: Diagnosis not present

## 2024-03-21 DIAGNOSIS — M25561 Pain in right knee: Secondary | ICD-10-CM
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
## 2024-03-22 ENCOUNTER — Encounter: Payer: Self-pay | Admitting: Family

## 2024-03-22 NOTE — Telephone Encounter (Signed)
 Do you mind making a cd with her tib fib xrays

## 2024-04-04 ENCOUNTER — Ambulatory Visit: Admission: EM | Admit: 2024-04-04 | Discharge: 2024-04-04 | Disposition: A | Discharge status: Home / Self Care

## 2024-04-04 ENCOUNTER — Other Ambulatory Visit: Payer: Self-pay

## 2024-04-04 DIAGNOSIS — J069 Acute upper respiratory infection, unspecified: Secondary | ICD-10-CM

## 2024-04-04 LAB — POC COVID19/FLU A&B COMBO
Covid Antigen, POC: NEGATIVE
Influenza A Antigen, POC: NEGATIVE
Influenza B Antigen, POC: NEGATIVE

## 2024-04-04 MED ORDER — CETIRIZINE HCL 10 MG PO TABS: 10.0000 mg | ORAL_TABLET | Freq: Every day | ORAL | 0 refills | Status: DC

## 2024-04-04 MED ORDER — IPRATROPIUM-ALBUTEROL 0.5-2.5 (3) MG/3ML IN SOLN
3.0000 mL | Freq: Once | RESPIRATORY_TRACT | Status: AC
  Administered 2024-04-04: 3 mL via RESPIRATORY_TRACT

## 2024-04-04 MED ORDER — PSEUDOEPH-BROMPHEN-DM 30-2-10 MG/5ML PO SYRP: 10.0000 mL | ORAL_SOLUTION | Freq: Four times a day (QID) | ORAL | 0 refills | Status: DC | PRN

## 2024-04-04 MED ORDER — IBUPROFEN 800 MG PO TABS
800.0000 mg | ORAL_TABLET | Freq: Once | ORAL | Status: AC
  Administered 2024-04-04: 800 mg via ORAL

## 2024-04-04 MED ORDER — PREDNISONE 10 MG PO TABS: 20.0000 mg | ORAL_TABLET | Freq: Every day | ORAL | 0 refills | Status: AC
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
## 2024-04-04 NOTE — ED Triage Notes (Signed)
 Pt c/o congested nose, runny nose, 100.something fever highestx3d. Productive cough w/green mucous started yesterday. Pt has a hoarse voice.

## 2024-04-04 NOTE — Discharge Instructions (Signed)
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
## 2024-04-06 ENCOUNTER — Ambulatory Visit: Attending: Family

## 2024-04-06 ENCOUNTER — Other Ambulatory Visit: Payer: Self-pay

## 2024-04-06 DIAGNOSIS — R2689 Other abnormalities of gait and mobility: Secondary | ICD-10-CM | POA: Diagnosis not present

## 2024-04-06 DIAGNOSIS — M79604 Pain in right leg: Secondary | ICD-10-CM | POA: Diagnosis not present

## 2024-04-06 DIAGNOSIS — M79661 Pain in right lower leg: Secondary | ICD-10-CM | POA: Diagnosis not present

## 2024-04-06 DIAGNOSIS — M25371 Other instability, right ankle: Secondary | ICD-10-CM | POA: Diagnosis not present

## 2024-04-06 DIAGNOSIS — M6281 Muscle weakness (generalized): Secondary | ICD-10-CM | POA: Diagnosis not present

## 2024-04-06 DIAGNOSIS — M25561 Pain in right knee: Secondary | ICD-10-CM | POA: Diagnosis not present

## 2024-04-06 DIAGNOSIS — G8929 Other chronic pain: Secondary | ICD-10-CM | POA: Insufficient documentation

## 2024-04-06 DIAGNOSIS — R600 Localized edema: Secondary | ICD-10-CM | POA: Diagnosis not present

## 2024-04-07 ENCOUNTER — Ambulatory Visit

## 2024-04-19 ENCOUNTER — Encounter: Payer: Self-pay | Admitting: Physical Therapy

## 2024-04-19 ENCOUNTER — Ambulatory Visit: Attending: Family | Admitting: Physical Therapy

## 2024-04-19 DIAGNOSIS — M6281 Muscle weakness (generalized): Secondary | ICD-10-CM | POA: Insufficient documentation

## 2024-04-19 DIAGNOSIS — R2689 Other abnormalities of gait and mobility: Secondary | ICD-10-CM | POA: Diagnosis not present

## 2024-04-19 DIAGNOSIS — R600 Localized edema: Secondary | ICD-10-CM | POA: Diagnosis not present

## 2024-04-19 DIAGNOSIS — M79604 Pain in right leg: Secondary | ICD-10-CM | POA: Insufficient documentation
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
## 2024-04-20 ENCOUNTER — Ambulatory Visit: Admitting: Physical Therapy

## 2024-04-20 ENCOUNTER — Encounter: Payer: Self-pay | Admitting: Physical Therapy

## 2024-04-20 DIAGNOSIS — M6281 Muscle weakness (generalized): Secondary | ICD-10-CM

## 2024-04-20 DIAGNOSIS — M79604 Pain in right leg: Secondary | ICD-10-CM | POA: Diagnosis not present

## 2024-04-20 DIAGNOSIS — R600 Localized edema: Secondary | ICD-10-CM | POA: Diagnosis not present

## 2024-04-20 DIAGNOSIS — R2689 Other abnormalities of gait and mobility: Secondary | ICD-10-CM | POA: Diagnosis not present
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
## 2024-04-21 ENCOUNTER — Encounter: Admitting: Physical Therapy

## 2024-04-25 ENCOUNTER — Encounter: Payer: Self-pay | Admitting: Physical Therapy

## 2024-04-25 ENCOUNTER — Ambulatory Visit: Admitting: Physical Therapy

## 2024-04-25 DIAGNOSIS — R600 Localized edema: Secondary | ICD-10-CM | POA: Diagnosis not present

## 2024-04-25 DIAGNOSIS — R2689 Other abnormalities of gait and mobility: Secondary | ICD-10-CM | POA: Diagnosis not present

## 2024-04-25 DIAGNOSIS — M6281 Muscle weakness (generalized): Secondary | ICD-10-CM

## 2024-04-25 DIAGNOSIS — M79604 Pain in right leg: Secondary | ICD-10-CM
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
## 2024-04-27 ENCOUNTER — Encounter: Admitting: Physical Therapy

## 2024-04-28 ENCOUNTER — Ambulatory Visit: Admitting: Physical Therapy

## 2024-04-28 ENCOUNTER — Encounter: Payer: Self-pay | Admitting: Physical Therapy

## 2024-04-28 DIAGNOSIS — R600 Localized edema: Secondary | ICD-10-CM

## 2024-04-28 DIAGNOSIS — R2689 Other abnormalities of gait and mobility: Secondary | ICD-10-CM

## 2024-04-28 DIAGNOSIS — M79604 Pain in right leg: Secondary | ICD-10-CM | POA: Diagnosis not present

## 2024-04-28 DIAGNOSIS — M6281 Muscle weakness (generalized): Secondary | ICD-10-CM

## 2024-05-01 ENCOUNTER — Encounter

## 2024-05-03 ENCOUNTER — Ambulatory Visit: Admitting: Physical Therapy

## 2024-05-04 ENCOUNTER — Encounter: Payer: Self-pay | Admitting: Physical Therapy

## 2024-05-05 ENCOUNTER — Ambulatory Visit: Admitting: Physical Therapy

## 2024-05-05 ENCOUNTER — Encounter: Payer: Self-pay | Admitting: Physical Therapy

## 2024-05-05 DIAGNOSIS — M6281 Muscle weakness (generalized): Secondary | ICD-10-CM

## 2024-05-05 DIAGNOSIS — M79604 Pain in right leg: Secondary | ICD-10-CM

## 2024-05-05 DIAGNOSIS — R600 Localized edema: Secondary | ICD-10-CM | POA: Diagnosis not present

## 2024-05-05 DIAGNOSIS — R2689 Other abnormalities of gait and mobility: Secondary | ICD-10-CM | POA: Diagnosis not present
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
## 2024-05-08 ENCOUNTER — Encounter: Payer: Self-pay | Admitting: Physical Therapy

## 2024-05-08 ENCOUNTER — Ambulatory Visit: Admitting: Physical Therapy

## 2024-05-08 DIAGNOSIS — M79604 Pain in right leg: Secondary | ICD-10-CM | POA: Diagnosis not present

## 2024-05-08 DIAGNOSIS — R2689 Other abnormalities of gait and mobility: Secondary | ICD-10-CM | POA: Diagnosis not present

## 2024-05-08 DIAGNOSIS — M6281 Muscle weakness (generalized): Secondary | ICD-10-CM

## 2024-05-08 DIAGNOSIS — R600 Localized edema: Secondary | ICD-10-CM | POA: Diagnosis not present
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
## 2024-05-10 ENCOUNTER — Ambulatory Visit: Attending: Family | Admitting: Physical Therapy

## 2024-05-10 ENCOUNTER — Encounter: Payer: Self-pay | Admitting: Physical Therapy

## 2024-05-10 DIAGNOSIS — M6281 Muscle weakness (generalized): Secondary | ICD-10-CM | POA: Diagnosis not present

## 2024-05-10 DIAGNOSIS — M79604 Pain in right leg: Secondary | ICD-10-CM | POA: Insufficient documentation
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
## 2024-05-17 ENCOUNTER — Encounter: Payer: Self-pay | Admitting: Physical Therapy

## 2024-05-17 ENCOUNTER — Ambulatory Visit: Payer: Self-pay | Admitting: Physical Therapy

## 2024-05-17 DIAGNOSIS — M79604 Pain in right leg: Secondary | ICD-10-CM | POA: Diagnosis not present

## 2024-05-17 DIAGNOSIS — M6281 Muscle weakness (generalized): Secondary | ICD-10-CM
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
## 2024-05-18 ENCOUNTER — Ambulatory Visit: Admitting: Physical Therapy

## 2024-05-19 ENCOUNTER — Ambulatory Visit (INDEPENDENT_AMBULATORY_CARE_PROVIDER_SITE_OTHER): Admitting: Family

## 2024-05-19 ENCOUNTER — Encounter: Payer: Self-pay | Admitting: Family

## 2024-05-19 DIAGNOSIS — G8929 Other chronic pain: Secondary | ICD-10-CM

## 2024-05-19 DIAGNOSIS — S82209S Unspecified fracture of shaft of unspecified tibia, sequela: Secondary | ICD-10-CM

## 2024-05-19 DIAGNOSIS — M25571 Pain in right ankle and joints of right foot: Secondary | ICD-10-CM | POA: Diagnosis not present

## 2024-05-19 MED ORDER — METHYLPREDNISOLONE ACETATE 40 MG/ML IJ SUSP
40.0000 mg | INTRAMUSCULAR | Status: AC | PRN
Start: 2024-05-19 — End: 2024-05-19
  Administered 2024-05-19: 40 mg via INTRA_ARTICULAR

## 2024-05-19 MED ORDER — LIDOCAINE HCL 1 % IJ SOLN
2.0000 mL | INTRAMUSCULAR | Status: AC | PRN
Start: 2024-05-19 — End: 2024-05-19
  Administered 2024-05-19: 2 mL
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
## 2024-05-23 ENCOUNTER — Ambulatory Visit: Admitting: Physical Therapy

## 2024-05-23 ENCOUNTER — Telehealth: Payer: Self-pay | Admitting: Physical Therapy

## 2024-05-23 NOTE — Telephone Encounter (Signed)
 Attempted to call pt but VM is full.  D/C

## 2024-06-05 ENCOUNTER — Encounter: Payer: Self-pay | Admitting: Internal Medicine

## 2024-06-05 ENCOUNTER — Other Ambulatory Visit (HOSPITAL_COMMUNITY)
Admission: RE | Admit: 2024-06-05 | Discharge: 2024-06-05 | Disposition: A | Discharge status: Home / Self Care | Source: Ambulatory Visit | Attending: Internal Medicine | Admitting: Internal Medicine

## 2024-06-05 ENCOUNTER — Ambulatory Visit (INDEPENDENT_AMBULATORY_CARE_PROVIDER_SITE_OTHER): Admitting: Internal Medicine

## 2024-06-05 ENCOUNTER — Ambulatory Visit: Admitting: Orthopedic Surgery

## 2024-06-05 VITALS — BP 110/80 | HR 93 | Temp 98.2°F | Ht 66.5 in | Wt 209.8 lb

## 2024-06-05 DIAGNOSIS — Z0000 Encounter for general adult medical examination without abnormal findings: Secondary | ICD-10-CM | POA: Diagnosis not present

## 2024-06-05 DIAGNOSIS — G43701 Chronic migraine without aura, not intractable, with status migrainosus: Secondary | ICD-10-CM

## 2024-06-05 DIAGNOSIS — Z23 Encounter for immunization: Secondary | ICD-10-CM

## 2024-06-05 DIAGNOSIS — Z124 Encounter for screening for malignant neoplasm of cervix: Secondary | ICD-10-CM | POA: Insufficient documentation

## 2024-06-05 DIAGNOSIS — F4323 Adjustment disorder with mixed anxiety and depressed mood: Secondary | ICD-10-CM

## 2024-06-05 DIAGNOSIS — K219 Gastro-esophageal reflux disease without esophagitis: Secondary | ICD-10-CM | POA: Diagnosis not present

## 2024-06-05 DIAGNOSIS — Z113 Encounter for screening for infections with a predominantly sexual mode of transmission: Secondary | ICD-10-CM | POA: Insufficient documentation

## 2024-06-05 LAB — LIPID PANEL
Cholesterol: 166 mg/dL (ref 0–200)
HDL: 46.5 mg/dL (ref >39.00)
LDL Cholesterol: 55 mg/dL (ref 0–99)
NonHDL: 119.55
Total CHOL/HDL Ratio: 4
Triglycerides: 321 mg/dL — ABNORMAL HIGH (ref 0.0–149.0)
VLDL: 64.2 mg/dL — ABNORMAL HIGH (ref 0.0–40.0)

## 2024-06-05 LAB — CBC WITH DIFFERENTIAL/PLATELET
Basophils Absolute: 0.1 K/uL (ref 0.0–0.1)
Basophils Relative: 0.9 % (ref 0.0–3.0)
Eosinophils Absolute: 0 K/uL (ref 0.0–0.7)
Eosinophils Relative: 0.7 % (ref 0.0–5.0)
HCT: 39.3 % (ref 36.0–46.0)
Hemoglobin: 12.4 g/dL (ref 12.0–15.0)
Lymphocytes Relative: 29.7 % (ref 12.0–46.0)
Lymphs Abs: 2 K/uL (ref 0.7–4.0)
MCHC: 31.5 g/dL (ref 30.0–36.0)
MCV: 75.8 fl — ABNORMAL LOW (ref 78.0–100.0)
Monocytes Absolute: 0.4 K/uL (ref 0.1–1.0)
Monocytes Relative: 5.7 % (ref 3.0–12.0)
Neutro Abs: 4.2 K/uL (ref 1.4–7.7)
Neutrophils Relative %: 63 % (ref 43.0–77.0)
Platelets: 356 K/uL (ref 150.0–400.0)
RBC: 5.18 Mil/uL — ABNORMAL HIGH (ref 3.87–5.11)
RDW: 16 % — ABNORMAL HIGH (ref 11.5–15.5)
WBC: 6.6 K/uL (ref 4.0–10.5)

## 2024-06-05 LAB — COMPREHENSIVE METABOLIC PANEL WITH GFR
ALT: 15 U/L (ref 0–35)
AST: 12 U/L (ref 0–37)
Albumin: 4.1 g/dL (ref 3.5–5.2)
Alkaline Phosphatase: 76 U/L (ref 39–117)
BUN: 7 mg/dL (ref 6–23)
CO2: 25 meq/L (ref 19–32)
Calcium: 8.8 mg/dL (ref 8.4–10.5)
Chloride: 104 meq/L (ref 96–112)
Creatinine, Ser: 0.7 mg/dL (ref 0.40–1.20)
GFR: 119.33 mL/min (ref >60.00)
Glucose, Bld: 88 mg/dL (ref 70–99)
Potassium: 3.7 meq/L (ref 3.5–5.1)
Sodium: 137 meq/L (ref 135–145)
Total Bilirubin: 0.3 mg/dL (ref 0.2–1.2)
Total Protein: 7.5 g/dL (ref 6.0–8.3)

## 2024-06-05 MED ORDER — VENLAFAXINE HCL ER 75 MG PO CP24
75.0000 mg | ORAL_CAPSULE | Freq: Every day | ORAL | 0 refills | Status: DC
Start: 2024-06-05 — End: 2024-08-30

## 2024-06-05 MED ORDER — VENLAFAXINE HCL ER 37.5 MG PO CP24: 37.5000 mg | ORAL_CAPSULE | Freq: Every day | ORAL | 0 refills | Status: DC

## 2024-06-05 MED ORDER — SUMATRIPTAN SUCCINATE 50 MG PO TABS
50.0000 mg | ORAL_TABLET | ORAL | 2 refills | Status: AC | PRN
Start: 2024-06-05 — End: ?
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
## 2024-06-05 NOTE — Patient Instructions (Addendum)
 Acid Reflux:  Take Omeprazole 20mg  once daily  Can do peptobismol as needed for loose stools.     [facility] [address] [place],  [place]  [address]  Main: [phone]

## 2024-06-06 ENCOUNTER — Encounter: Payer: Self-pay | Admitting: Orthopedic Surgery

## 2024-06-06 ENCOUNTER — Ambulatory Visit (INDEPENDENT_AMBULATORY_CARE_PROVIDER_SITE_OTHER): Admitting: Orthopedic Surgery

## 2024-06-06 DIAGNOSIS — M25571 Pain in right ankle and joints of right foot: Secondary | ICD-10-CM

## 2024-06-06 DIAGNOSIS — G8929 Other chronic pain: Secondary | ICD-10-CM | POA: Diagnosis not present

## 2024-06-06 DIAGNOSIS — T85848A Pain due to other internal prosthetic devices, implants and grafts, initial encounter: Secondary | ICD-10-CM | POA: Diagnosis not present

## 2024-06-06 LAB — TSH: TSH: 0.83 u[IU]/mL (ref 0.35–5.50)
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
## 2024-06-08 ENCOUNTER — Telehealth: Payer: Self-pay | Admitting: Orthopedic Surgery

## 2024-06-08 NOTE — Telephone Encounter (Signed)
 Called patient to schedule right ankle hardware removal with Dr Harden.  No answer.  Left message on voicemail at [phone].  Provided my name and direct number for scheduling.

## 2024-06-11 LAB — CYTOLOGY - PAP
Chlamydia: NEGATIVE
Comment: NEGATIVE
Comment: NEGATIVE
Comment: NORMAL
Diagnosis: NEGATIVE
Neisseria Gonorrhea: NEGATIVE
Trichomonas: NEGATIVE

## 2024-06-12 ENCOUNTER — Ambulatory Visit: Payer: Self-pay | Admitting: Internal Medicine

## 2024-06-15 ENCOUNTER — Other Ambulatory Visit: Payer: Self-pay

## 2024-06-15 ENCOUNTER — Encounter (HOSPITAL_COMMUNITY): Payer: Self-pay | Admitting: Orthopedic Surgery

## 2024-06-15 NOTE — Progress Notes (Signed)
 SDW CALL  Patient was given pre-op instructions over the phone. The opportunity was given for the patient to ask questions. No further questions asked. Patient verbalized understanding of instructions given.   Date & Arrival Time June 16, 2024 at 1055 am  PCP - Billy Knee, FNP  Cardiologist -   PPM/ICD - denies Device Orders - n/a Rep Notified - n/a  Chest x-ray - 05-30-23 EKG - 05-30-23 Stress Test -  ECHO -  Cardiac Cath -   Sleep Study - denies CPAP - n/a  DM -denies  Blood Thinner Instructions: denies Aspirin Instructions:denies  ERAS Protcol - Clear liquids until 10:25 am.    COVID TEST-    Anesthesia review:   Patient denies shortness of breath, fever, cough and chest pain over the phone call   All instructions explained to the patient, with a verbal understanding of the material. Patient agrees to go over the instructions while at home for a better understanding.

## 2024-06-16 ENCOUNTER — Other Ambulatory Visit: Payer: Self-pay

## 2024-06-16 ENCOUNTER — Encounter (HOSPITAL_COMMUNITY): Payer: Self-pay | Admitting: Orthopedic Surgery

## 2024-06-16 ENCOUNTER — Ambulatory Visit (HOSPITAL_COMMUNITY): Admitting: Anesthesiology

## 2024-06-16 ENCOUNTER — Encounter (HOSPITAL_COMMUNITY)
Admission: RE | Disposition: A | Discharge status: Home / Self Care | Payer: Self-pay | Source: Home / Self Care | Attending: Orthopedic Surgery

## 2024-06-16 ENCOUNTER — Ambulatory Visit (HOSPITAL_COMMUNITY)
Admission: RE | Admit: 2024-06-16 | Discharge: 2024-06-16 | Disposition: A | Discharge status: Home / Self Care | Attending: Orthopedic Surgery | Admitting: Orthopedic Surgery

## 2024-06-16 DIAGNOSIS — Y831 Surgical operation with implant of artificial internal device as the cause of abnormal reaction of the patient, or of later complication, without mention of misadventure at the time of the procedure: Secondary | ICD-10-CM | POA: Diagnosis not present

## 2024-06-16 DIAGNOSIS — G8929 Other chronic pain: Secondary | ICD-10-CM | POA: Diagnosis not present

## 2024-06-16 DIAGNOSIS — K219 Gastro-esophageal reflux disease without esophagitis: Secondary | ICD-10-CM | POA: Diagnosis not present

## 2024-06-16 DIAGNOSIS — J45909 Unspecified asthma, uncomplicated: Secondary | ICD-10-CM | POA: Diagnosis not present

## 2024-06-16 DIAGNOSIS — R519 Headache, unspecified: Secondary | ICD-10-CM | POA: Insufficient documentation

## 2024-06-16 DIAGNOSIS — F419 Anxiety disorder, unspecified: Secondary | ICD-10-CM | POA: Diagnosis not present

## 2024-06-16 DIAGNOSIS — M25571 Pain in right ankle and joints of right foot: Secondary | ICD-10-CM

## 2024-06-16 DIAGNOSIS — T85848A Pain due to other internal prosthetic devices, implants and grafts, initial encounter: Secondary | ICD-10-CM

## 2024-06-16 DIAGNOSIS — T8484XA Pain due to internal orthopedic prosthetic devices, implants and grafts, initial encounter: Secondary | ICD-10-CM | POA: Insufficient documentation

## 2024-06-16 HISTORY — DX: Unspecified asthma, uncomplicated: J45.909

## 2024-06-16 HISTORY — PX: HARDWARE REMOVAL: SHX979

## 2024-06-16 LAB — POCT PREGNANCY, URINE: Preg Test, Ur: NEGATIVE

## 2024-06-16 SURGERY — REMOVAL, HARDWARE
Anesthesia: General | Site: Ankle | Laterality: Right

## 2024-06-16 MED ORDER — ACETAMINOPHEN 500 MG PO TABS
1000.0000 mg | ORAL_TABLET | Freq: Once | ORAL | Status: AC
  Administered 2024-06-16: 1000 mg via ORAL
  Filled 2024-06-16: qty 2

## 2024-06-16 MED ORDER — SODIUM CHLORIDE (PF) 0.9 % IJ SOLN
INTRAMUSCULAR | Status: AC
  Filled 2024-06-16: qty 10

## 2024-06-16 MED ORDER — OXYCODONE HCL 5 MG PO TABS
5.0000 mg | ORAL_TABLET | Freq: Once | ORAL | Status: AC | PRN
  Administered 2024-06-16: 5 mg via ORAL

## 2024-06-16 MED ORDER — OXYCODONE HCL 5 MG PO TABS
ORAL_TABLET | ORAL | Status: AC
  Filled 2024-06-16: qty 1

## 2024-06-16 MED ORDER — FENTANYL CITRATE (PF) 100 MCG/2ML IJ SOLN
25.0000 ug | INTRAMUSCULAR | Status: DC | PRN
  Administered 2024-06-16: 50 ug via INTRAVENOUS

## 2024-06-16 MED ORDER — LACTATED RINGERS IV SOLN: INTRAVENOUS | Status: DC

## 2024-06-16 MED ORDER — FENTANYL CITRATE (PF) 250 MCG/5ML IJ SOLN
INTRAMUSCULAR | Status: DC | PRN
  Administered 2024-06-16 (×2): 50 ug via INTRAVENOUS

## 2024-06-16 MED ORDER — CHLORHEXIDINE GLUCONATE 0.12 % MT SOLN
15.0000 mL | Freq: Once | OROMUCOSAL | Status: AC
  Administered 2024-06-16: 15 mL via OROMUCOSAL
  Filled 2024-06-16: qty 15

## 2024-06-16 MED ORDER — ORAL CARE MOUTH RINSE: 15.0000 mL | Freq: Once | OROMUCOSAL | Status: AC

## 2024-06-16 MED ORDER — DEXAMETHASONE SODIUM PHOSPHATE 10 MG/ML IJ SOLN
INTRAMUSCULAR | Status: AC
  Filled 2024-06-16: qty 1

## 2024-06-16 MED ORDER — FENTANYL CITRATE (PF) 100 MCG/2ML IJ SOLN
INTRAMUSCULAR | Status: AC
  Filled 2024-06-16: qty 2

## 2024-06-16 MED ORDER — OXYCODONE-ACETAMINOPHEN 5-325 MG PO TABS
1.0000 | ORAL_TABLET | ORAL | 0 refills | Status: AC | PRN
Start: 2024-06-16 — End: ?

## 2024-06-16 MED ORDER — FENTANYL CITRATE (PF) 250 MCG/5ML IJ SOLN
INTRAMUSCULAR | Status: AC
  Filled 2024-06-16: qty 5

## 2024-06-16 MED ORDER — LIDOCAINE 2% (20 MG/ML) 5 ML SYRINGE
INTRAMUSCULAR | Status: DC | PRN
Start: 2024-06-16 — End: 2024-06-16
  Administered 2024-06-16: 40 mg via INTRAVENOUS

## 2024-06-16 MED ORDER — ACETAMINOPHEN 10 MG/ML IV SOLN: 1000.0000 mg | Freq: Once | INTRAVENOUS | Status: DC | PRN

## 2024-06-16 MED ORDER — GLYCOPYRROLATE PF 0.2 MG/ML IJ SOSY
PREFILLED_SYRINGE | INTRAMUSCULAR | Status: DC | PRN
  Administered 2024-06-16: .1 mg via INTRAVENOUS

## 2024-06-16 MED ORDER — DEXAMETHASONE SODIUM PHOSPHATE 10 MG/ML IJ SOLN
INTRAMUSCULAR | Status: DC | PRN
Start: 2024-06-16 — End: 2024-06-16
  Administered 2024-06-16: 5 mg via INTRAVENOUS

## 2024-06-16 MED ORDER — ONDANSETRON HCL 4 MG/2ML IJ SOLN
INTRAMUSCULAR | Status: AC
  Filled 2024-06-16: qty 2

## 2024-06-16 MED ORDER — DROPERIDOL 2.5 MG/ML IJ SOLN: 0.6250 mg | Freq: Once | INTRAMUSCULAR | Status: DC | PRN

## 2024-06-16 MED ORDER — MIDAZOLAM HCL 2 MG/2ML IJ SOLN
INTRAMUSCULAR | Status: AC
  Filled 2024-06-16: qty 2

## 2024-06-16 MED ORDER — PROPOFOL 10 MG/ML IV BOLUS
INTRAVENOUS | Status: DC | PRN
  Administered 2024-06-16: 200 mg via INTRAVENOUS

## 2024-06-16 MED ORDER — OXYCODONE HCL 5 MG/5ML PO SOLN: 5.0000 mg | Freq: Once | ORAL | Status: AC | PRN

## 2024-06-16 MED ORDER — PHENYLEPHRINE HCL-NACL 20-0.9 MG/250ML-% IV SOLN
INTRAVENOUS | Status: DC | PRN
Start: 2024-06-16 — End: 2024-06-16
  Administered 2024-06-16: 40 ug/min via INTRAVENOUS

## 2024-06-16 MED ORDER — CEFAZOLIN SODIUM-DEXTROSE 2-4 GM/100ML-% IV SOLN
2.0000 g | INTRAVENOUS | Status: AC
  Administered 2024-06-16: 2 g via INTRAVENOUS
  Filled 2024-06-16: qty 100

## 2024-06-16 MED ORDER — MIDAZOLAM HCL 2 MG/2ML IJ SOLN
INTRAMUSCULAR | Status: DC | PRN
  Administered 2024-06-16: 2 mg via INTRAVENOUS

## 2024-06-16 MED ORDER — ONDANSETRON HCL 4 MG/2ML IJ SOLN
INTRAMUSCULAR | Status: DC | PRN
  Administered 2024-06-16: 4 mg via INTRAVENOUS

## 2024-06-16 MED ORDER — SCOPOLAMINE 1 MG/3DAYS TD PT72
1.0000 | MEDICATED_PATCH | TRANSDERMAL | Status: DC
  Administered 2024-06-16: 1.5 mg via TRANSDERMAL
  Filled 2024-06-16: qty 1

## 2024-06-16 MED ORDER — VASHE WOUND IRRIGATION OPTIME
TOPICAL | Status: DC | PRN
  Administered 2024-06-16: 34 [oz_av]

## 2024-06-16 MED ORDER — GLYCOPYRROLATE PF 0.2 MG/ML IJ SOSY
PREFILLED_SYRINGE | INTRAMUSCULAR | Status: AC
  Filled 2024-06-16: qty 1

## 2024-06-16 SURGICAL SUPPLY — 35 items
BAG COUNTER SPONGE SURGICOUNT (BAG) ×1 IMPLANT
BANDAGE ESMARK 6X9 LF (GAUZE/BANDAGES/DRESSINGS) IMPLANT
BNDG COHESIVE 4X5 TAN STRL LF (GAUZE/BANDAGES/DRESSINGS) IMPLANT
BNDG GAUZE DERMACEA FLUFF 4 (GAUZE/BANDAGES/DRESSINGS) ×1 IMPLANT
COVER SURGICAL LIGHT HANDLE (MISCELLANEOUS) ×2 IMPLANT
CUFF TOURN SGL QUICK 42 (TOURNIQUET CUFF) IMPLANT
CUFF TRNQT CYL 34X4.125X (TOURNIQUET CUFF) IMPLANT
DRAPE C-ARM 42X72 X-RAY (DRAPES) IMPLANT
DRAPE INCISE IOBAN 66X45 STRL (DRAPES) IMPLANT
DRAPE SURG ORHT 6 SPLT 77X108 (DRAPES) IMPLANT
DRAPE U-SHAPE 47X51 STRL (DRAPES) ×1 IMPLANT
DRSG EMULSION OIL 3X3 NADH (GAUZE/BANDAGES/DRESSINGS) ×1 IMPLANT
DURAPREP 26ML APPLICATOR (WOUND CARE) ×1 IMPLANT
ELECTRODE REM PT RTRN 9FT ADLT (ELECTROSURGICAL) ×1 IMPLANT
GAUZE PAD ABD 8X10 STRL (GAUZE/BANDAGES/DRESSINGS) ×1 IMPLANT
GAUZE SPONGE 4X4 12PLY STRL (GAUZE/BANDAGES/DRESSINGS) ×1 IMPLANT
GLOVE BIOGEL PI IND STRL 9 (GLOVE) ×1 IMPLANT
GLOVE SURG ORTHO 9.0 STRL STRW (GLOVE) ×1 IMPLANT
GOWN STRL REUS W/ TWL XL LVL3 (GOWN DISPOSABLE) ×3 IMPLANT
KIT BASIN OR (CUSTOM PROCEDURE TRAY) ×1 IMPLANT
KIT TURNOVER KIT B (KITS) ×1 IMPLANT
MANIFOLD NEPTUNE II (INSTRUMENTS) ×1 IMPLANT
NS IRRIG 1000ML POUR BTL (IV SOLUTION) ×1 IMPLANT
PACK ORTHO EXTREMITY (CUSTOM PROCEDURE TRAY) ×1 IMPLANT
PAD ARMBOARD POSITIONER FOAM (MISCELLANEOUS) ×2 IMPLANT
SPONGE T-LAP 18X18 ~~LOC~~+RFID (SPONGE) IMPLANT
STAPLER SKIN PROX 35W (STAPLE) IMPLANT
STOCKINETTE IMPERVIOUS 9X36 MD (GAUZE/BANDAGES/DRESSINGS) IMPLANT
SUT ETHILON 2 0 PSLX (SUTURE) IMPLANT
SUT VIC AB 0 CT1 27XBRD ANBCTR (SUTURE) IMPLANT
SUT VIC AB 2-0 CT1 TAPERPNT 27 (SUTURE) IMPLANT
TOWEL GREEN STERILE (TOWEL DISPOSABLE) ×1 IMPLANT
TOWEL GREEN STERILE FF (TOWEL DISPOSABLE) ×1 IMPLANT
UNDERPAD 30X36 HEAVY ABSORB (UNDERPADS AND DIAPERS) ×1 IMPLANT
WATER STERILE IRR 1000ML POUR (IV SOLUTION) ×1 IMPLANT

## 2024-06-16 NOTE — Transfer of Care (Signed)
 Immediate Anesthesia Transfer of Care Note  Patient: Michelle Bird  Procedure(s) Performed: REMOVAL, HARDWARE (Right: Ankle)  Patient Location: PACU  Anesthesia Type:General  Level of Consciousness: awake and patient cooperative  Airway & Oxygen Therapy: Patient Spontanous Breathing and Patient connected to face mask oxygen  Post-op Assessment: Report given to RN, Post -op Vital signs reviewed and stable, and Patient moving all extremities  Post vital signs: Reviewed and stable  Last Vitals:  Vitals Value Taken Time  BP 129/81 06/16/24 13:30  Temp 36.5 C 06/16/24 13:30  Pulse 88 06/16/24 13:33  Resp 17 06/16/24 13:33  SpO2 99 % 06/16/24 13:33  Vitals shown include unfiled device data.  Last Pain:  Vitals:   06/16/24 1330  TempSrc:   PainSc: 7          Complications: No notable events documented.

## 2024-06-16 NOTE — Interval H&P Note (Signed)
 History and Physical Interval Note:  06/16/2024 10:42 AM  Michelle Bird  has presented today for surgery, with the diagnosis of painful retained hardware right ankle.  The various methods of treatment have been discussed with the patient and family. After consideration of risks, benefits and other options for treatment, the patient has consented to  Procedure(s) with comments: REMOVAL, HARDWARE (Right) - RIGHT ANKLE REMOVAL DEEP SCREWS as a surgical intervention.  The patient's history has been reviewed, patient examined, no change in status, stable for surgery.  I have reviewed the patient's chart and labs.  Questions were answered to the patient's satisfaction.     Michelle Bird

## 2024-06-16 NOTE — Anesthesia Postprocedure Evaluation (Addendum)
 Anesthesia Post Note  Patient: Michelle Bird  Procedure(s) Performed: REMOVAL, HARDWARE (Right: Ankle)     Patient location during evaluation: PACU Anesthesia Type: General Level of consciousness: awake and alert Pain management: pain level controlled Vital Signs Assessment: post-procedure vital signs reviewed and stable Respiratory status: spontaneous breathing, nonlabored ventilation, respiratory function stable and patient connected to nasal cannula oxygen Cardiovascular status: blood pressure returned to baseline and stable Postop Assessment: no apparent nausea or vomiting Anesthetic complications: no   No notable events documented.  Last Vitals:  Vitals:   06/16/24 1345 06/16/24 1400  BP: 123/83 115/73  Pulse: 72 77  Resp: 14 20  Temp:    SpO2: 99% 100%                [name]
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
## 2024-06-16 NOTE — Anesthesia Procedure Notes (Addendum)
 Procedure Name: LMA Insertion Date/Time: 06/16/2024 12:54 PM  Performed by: Jolynn Mage, CRNAPre-anesthesia Checklist: Patient identified, Emergency Drugs available, Suction available and Patient being monitored Patient Re-evaluated:Patient Re-evaluated prior to induction Oxygen Delivery Method: Circle system utilized Preoxygenation: Pre-oxygenation with 100% oxygen Induction Type: IV induction Ventilation: Mask ventilation without difficulty LMA: LMA flexible inserted LMA Size: 4.0 Number of attempts: 1 Placement Confirmation: positive ETCO2 and breath sounds checked- equal and bilateral Tube secured with: Tape Dental Injury: Teeth and Oropharynx as per pre-operative assessment

## 2024-06-16 NOTE — Op Note (Signed)
 06/16/2024  1:21 PM  PATIENT:  Michelle Bird    PRE-OPERATIVE DIAGNOSIS:  painful retained hardware right ankle  POST-OPERATIVE DIAGNOSIS:  Same  PROCEDURE:  REMOVAL, HARDWARE C-arm fluoroscopy  I hardware location and removal.  SURGEON:  [name], MD  PHYSICIAN ASSISTANT:None ANESTHESIA:   General  PREOPERATIVE INDICATIONS:  Michelle Bird is a  28 y.o. female with a diagnosis of painful retained hardware right ankle who failed conservative measures and elected for surgical management.    The risks benefits and alternatives were discussed with the patient preoperatively including but not limited to the risks of infection, bleeding, nerve injury, cardiopulmonary complications, the need for revision surgery, among others, and the patient was willing to proceed.  OPERATIVE IMPLANTS:   * No implants in log *  @ENCIMAGES @  OPERATIVE FINDINGS: See en face be verified removal of the 2 distal interlocking screws.  OPERATIVE PROCEDURE: Patient was brought the operating room underwent a general anesthetic.  After adequate levels anesthesia were obtained patient's right lower extremity was prepped using DuraPrep draped into a sterile field a timeout was called.  The C-arm Foscavir was used to localize the 2 distal retaining screws.  A skin incision was made this was carried down to the hardware.  The 2 distal screws were removed without complication.  The wound was irrigated with Vashe.  Electrocautery was used for hemostasis.  Incision close using 2-0 nylon a sterile dressing applied patient was extubated taken the PACU in stable condition.   DISCHARGE PLANNING:  Antibiotic duration: Preoperative antibiotics  Weightbearing: Weightbearing as tolerated  Pain medication: Prescription for Percocet  Dressing care/ Wound VAC: Dry dressing  Ambulatory devices: Walker or crutches  Discharge to: Home.  Follow-up: In the office 1 week post operative.

## 2024-06-16 NOTE — Anesthesia Preprocedure Evaluation (Addendum)
 Anesthesia Evaluation  Patient identified by MRN, date of birth, ID band Patient awake    Reviewed: Allergy & Precautions, NPO status , Patient's Chart, lab work & pertinent test results  Airway Mallampati: I  TM Distance: >3 FB Neck ROM: Full    Dental  (+) Teeth Intact, Dental Advisory Given   Pulmonary asthma    breath sounds clear to auscultation       Cardiovascular negative cardio ROS  Rhythm:Regular Rate:Normal     Neuro/Psych  Headaches PSYCHIATRIC DISORDERS Anxiety        GI/Hepatic Neg liver ROS,GERD  Medicated,,  Endo/Other  negative endocrine ROS    Renal/GU negative Renal ROS     Musculoskeletal negative musculoskeletal ROS (+)    Abdominal   Peds  Hematology negative hematology ROS (+)   Anesthesia Other Findings   Reproductive/Obstetrics                              Anesthesia Physical Anesthesia Plan  ASA: 2  Anesthesia Plan: General   Post-op Pain Management: Tylenol  PO (pre-op)* and Toradol  IV (intra-op)*   Induction: Intravenous  PONV Risk Score and Plan: 4 or greater and Ondansetron , Dexamethasone , Midazolam  and Scopolamine  patch - Pre-op  Airway Management Planned: LMA  Additional Equipment: None  Intra-op Plan:   Post-operative Plan: Extubation in OR  Informed Consent: I have reviewed the patients History and Physical, chart, labs and discussed the procedure including the risks, benefits and alternatives for the proposed anesthesia with the patient or authorized representative who has indicated his/her understanding and acceptance.       Plan Discussed with: CRNA  Anesthesia Plan Comments:          Anesthesia Quick Evaluation

## 2024-06-17 ENCOUNTER — Encounter (HOSPITAL_COMMUNITY): Payer: Self-pay | Admitting: Orthopedic Surgery

## 2024-06-21 ENCOUNTER — Telehealth: Payer: Self-pay

## 2024-06-21 NOTE — Telephone Encounter (Signed)
 Pt had surgery 06/16/24 hardware removal and has been having redish discharge from the incision. She states there is constant decent amount drainage, no fever, limited pain. Pt states that the incision is a little red. Please call pt [phone] and advise. Pt says that if she does not answer it is okay to leave advice and instructions on vm.

## 2024-06-21 NOTE — Telephone Encounter (Signed)
 SW pt, let her know that this seems normal. She will look out of classic signs of infection. Thick green/yellow/brown discharge, increased pain, fever, hot to touch, chills, N&V.  She will make sure that she keeps off her foot and keeps foot elevated above heart level as much as possible. She has an appointment with us  on Friday for follow up.

## 2024-06-23 ENCOUNTER — Encounter: Payer: Self-pay | Admitting: Family

## 2024-06-23 ENCOUNTER — Ambulatory Visit (INDEPENDENT_AMBULATORY_CARE_PROVIDER_SITE_OTHER): Admitting: Family

## 2024-06-23 DIAGNOSIS — T85848D Pain due to other internal prosthetic devices, implants and grafts, subsequent encounter: Secondary | ICD-10-CM
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
## 2024-06-30 ENCOUNTER — Other Ambulatory Visit: Payer: Self-pay | Admitting: Internal Medicine

## 2024-06-30 DIAGNOSIS — F4323 Adjustment disorder with mixed anxiety and depressed mood: Secondary | ICD-10-CM

## 2024-06-30 DIAGNOSIS — G43701 Chronic migraine without aura, not intractable, with status migrainosus: Secondary | ICD-10-CM

## 2024-07-05 ENCOUNTER — Encounter: Payer: Self-pay | Admitting: Family

## 2024-07-05 ENCOUNTER — Ambulatory Visit (INDEPENDENT_AMBULATORY_CARE_PROVIDER_SITE_OTHER): Admitting: Family

## 2024-07-05 DIAGNOSIS — T85848D Pain due to other internal prosthetic devices, implants and grafts, subsequent encounter: Secondary | ICD-10-CM
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
## 2024-07-06 ENCOUNTER — Other Ambulatory Visit: Payer: Self-pay | Admitting: Internal Medicine

## 2024-07-06 DIAGNOSIS — G43701 Chronic migraine without aura, not intractable, with status migrainosus: Secondary | ICD-10-CM

## 2024-07-06 DIAGNOSIS — F4323 Adjustment disorder with mixed anxiety and depressed mood: Secondary | ICD-10-CM

## 2024-08-07 ENCOUNTER — Ambulatory Visit: Admitting: Internal Medicine

## 2024-08-29 ENCOUNTER — Other Ambulatory Visit: Payer: Self-pay | Admitting: Internal Medicine

## 2024-08-29 DIAGNOSIS — F4323 Adjustment disorder with mixed anxiety and depressed mood: Secondary | ICD-10-CM

## 2024-08-29 DIAGNOSIS — G43701 Chronic migraine without aura, not intractable, with status migrainosus: Secondary | ICD-10-CM

## 2024-09-11 ENCOUNTER — Encounter: Payer: Self-pay | Admitting: Radiology

## 2024-10-09 ENCOUNTER — Ambulatory Visit: Payer: Self-pay

## 2024-10-09 ENCOUNTER — Ambulatory Visit
Admission: EM | Admit: 2024-10-09 | Discharge: 2024-10-09 | Disposition: A | Discharge status: Home / Self Care | Attending: Family Medicine | Admitting: Family Medicine

## 2024-10-09 DIAGNOSIS — J4530 Mild persistent asthma, uncomplicated: Secondary | ICD-10-CM

## 2024-10-09 DIAGNOSIS — J040 Acute laryngitis: Secondary | ICD-10-CM

## 2024-10-09 DIAGNOSIS — J988 Other specified respiratory disorders: Secondary | ICD-10-CM

## 2024-10-09 DIAGNOSIS — B9789 Other viral agents as the cause of diseases classified elsewhere: Secondary | ICD-10-CM | POA: Diagnosis not present

## 2024-10-09 MED ORDER — ALBUTEROL SULFATE HFA 108 (90 BASE) MCG/ACT IN AERS: 1.0000 | INHALATION_SPRAY | Freq: Four times a day (QID) | RESPIRATORY_TRACT | 0 refills | Status: AC | PRN

## 2024-10-09 MED ORDER — PREDNISONE 20 MG PO TABS: ORAL_TABLET | ORAL | 0 refills | Status: DC

## 2024-10-09 MED ORDER — PROMETHAZINE-DM 6.25-15 MG/5ML PO SYRP: 5.0000 mL | ORAL_SOLUTION | Freq: Three times a day (TID) | ORAL | 0 refills | Status: AC | PRN

## 2024-10-09 MED ORDER — CETIRIZINE HCL 10 MG PO TABS: 10.0000 mg | ORAL_TABLET | Freq: Every day | ORAL | 0 refills | Status: AC

## 2024-10-09 NOTE — Discharge Instructions (Signed)
 We will manage this as a viral respiratory infection worsened with laryngitis and your underlying asthma. For sore throat or cough try using a honey-based tea. Use 3 teaspoons of honey with juice squeezed from half lemon. Place shaved pieces of ginger into 1/2-1 cup of water and warm over stove top. Then mix the ingredients and repeat every 4 hours as needed. Please take Tylenol  500mg -650mg  once every 6 hours for fevers, aches and pains. Hydrate very well with at least 2 liters (64 ounces) of water. Eat light meals such as soups (chicken and noodles, chicken wild rice, vegetable).  Do not eat any foods that you are allergic to.  Start an antihistamine like Zyrtec  (10mg  daily) for postnasal drainage, sinus congestion.  You can take this together with prednisone  and albuterol  inhaler to help with your respiratory symptoms, breathing, laryngitis.

## 2024-10-09 NOTE — Telephone Encounter (Signed)
 This RN attempted to contact pt with no answer. A vm was left with call back number provided.   Per chart review, pt was seen today at [facility].        Copied from CRM #[id number]. Topic: Clinical - Red Word Triage >> Oct 09, 2024  8:12 AM Mesmerise C wrote: [facility] that prompted transfer to Nurse Triage: Patient has a bad cough, has congestion, losing her voice, also states she's been feeling warm this morning hasn't checked her temperature been ongoing since Friday and been getting worse
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
## 2024-10-09 NOTE — ED Triage Notes (Signed)
 Pt reports head congestion and cough x 3 days. Muicnex gives no relief.

## 2024-10-12 ENCOUNTER — Emergency Department (HOSPITAL_COMMUNITY)

## 2024-10-12 ENCOUNTER — Emergency Department (HOSPITAL_COMMUNITY): Admission: EM | Admit: 2024-10-12 | Discharge: 2024-10-12 | Disposition: A | Discharge status: Home / Self Care

## 2024-10-12 ENCOUNTER — Encounter (HOSPITAL_COMMUNITY): Payer: Self-pay | Admitting: Emergency Medicine

## 2024-10-12 DIAGNOSIS — R079 Chest pain, unspecified: Secondary | ICD-10-CM | POA: Diagnosis not present

## 2024-10-12 DIAGNOSIS — R0789 Other chest pain: Secondary | ICD-10-CM | POA: Diagnosis not present

## 2024-10-12 MED ORDER — IBUPROFEN 400 MG PO TABS
600.0000 mg | ORAL_TABLET | Freq: Once | ORAL | Status: AC
  Administered 2024-10-12: 600 mg via ORAL
  Filled 2024-10-12: qty 1

## 2024-10-12 MED ORDER — OXYCODONE-ACETAMINOPHEN 5-325 MG PO TABS
1.0000 | ORAL_TABLET | Freq: Once | ORAL | Status: AC
  Administered 2024-10-12: 1 via ORAL
  Filled 2024-10-12: qty 1

## 2024-10-12 NOTE — Discharge Instructions (Addendum)
 Your EKG is normal.  I am discharging you home with recommendation to take Tylenol  and ibuprofen .  And keep a close eye in your symptoms.  If you start coughing up any blood, develop a fever, become short of breath please return to the emergency room.  Please use Tylenol  or ibuprofen  for pain.  You may use 600 mg ibuprofen  every 6 hours or 1000 mg of Tylenol  every 6 hours.  You may choose to alternate between the 2.  This would be most effective.  Not to exceed 4 g of Tylenol  within 24 hours.  Not to exceed 3200 mg ibuprofen  24 hours.
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
## 2024-11-21 ENCOUNTER — Ambulatory Visit
Admission: EM | Admit: 2024-11-21 | Discharge: 2024-11-21 | Disposition: A | Discharge status: Home / Self Care | Attending: Family Medicine | Admitting: Family Medicine

## 2024-11-21 DIAGNOSIS — J4531 Mild persistent asthma with (acute) exacerbation: Secondary | ICD-10-CM

## 2024-11-21 DIAGNOSIS — R0989 Other specified symptoms and signs involving the circulatory and respiratory systems: Secondary | ICD-10-CM

## 2024-11-21 DIAGNOSIS — J329 Chronic sinusitis, unspecified: Secondary | ICD-10-CM

## 2024-11-21 DIAGNOSIS — J40 Bronchitis, not specified as acute or chronic: Secondary | ICD-10-CM

## 2024-11-21 LAB — POC COVID19/FLU A&B COMBO
Covid Antigen, POC: NEGATIVE
Influenza A Antigen, POC: NEGATIVE
Influenza B Antigen, POC: NEGATIVE

## 2024-11-21 MED ORDER — PREDNISONE 20 MG PO TABS: ORAL_TABLET | ORAL | 0 refills | Status: AC

## 2024-11-21 NOTE — Discharge Instructions (Signed)
 We will manage your sinobronchitis infection with supportive. For sore throat or cough try using a honey-based tea. Use 3 teaspoons of honey with juice squeezed from half lemon. Place shaved pieces of ginger into 1/2-1 cup of water and warm over stove top. Then mix the ingredients and repeat every 4 hours as needed. Please take Tylenol  500mg -650mg  once every 6 hours for fevers, aches and pains. Hydrate very well with at least 2 liters (64 ounces) of water. Eat light meals such as soups (chicken and noodles, chicken wild rice, vegetable).  Do not eat any foods that you are allergic to.  Start an antihistamine like Zyrtec  (10mg  daily) for postnasal drainage, sinus congestion.  You can take this together with prednisone  and albuterol  to help with your respiratory symptoms.
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
## 2024-11-21 NOTE — ED Triage Notes (Signed)
 Pt states that she has some nasal congestion, sneezing, coughing and chest congestion. X2 days   Pt states that she has been taking Sudafed otc.

## 2024-11-22 ENCOUNTER — Ambulatory Visit: Admitting: Internal Medicine
